# Patient Record
Sex: Male | Born: 1950 | Race: White | Hispanic: No | Smoking: Former smoker
Health system: Southern US, Community
[De-identification: ages and names within clinical notes are randomized; demographics above are authoritative.]

## PROBLEM LIST (undated history)

## (undated) DIAGNOSIS — K760 Fatty (change of) liver, not elsewhere classified: Secondary | ICD-10-CM

## (undated) DIAGNOSIS — F419 Anxiety disorder, unspecified: Secondary | ICD-10-CM

## (undated) DIAGNOSIS — K759 Inflammatory liver disease, unspecified: Secondary | ICD-10-CM

## (undated) DIAGNOSIS — Z8619 Personal history of other infectious and parasitic diseases: Secondary | ICD-10-CM

## (undated) DIAGNOSIS — M1990 Unspecified osteoarthritis, unspecified site: Secondary | ICD-10-CM

## (undated) DIAGNOSIS — K219 Gastro-esophageal reflux disease without esophagitis: Secondary | ICD-10-CM

## (undated) DIAGNOSIS — H269 Unspecified cataract: Secondary | ICD-10-CM

## (undated) DIAGNOSIS — N3281 Overactive bladder: Secondary | ICD-10-CM

## (undated) HISTORY — DX: Personal history of other infectious and parasitic diseases: Z86.19

## (undated) HISTORY — PX: CATARACT EXTRACTION: SUR2

## (undated) HISTORY — PX: APPENDECTOMY: SHX54

## (undated) HISTORY — DX: Unspecified osteoarthritis, unspecified site: M19.90

## (undated) HISTORY — DX: Overactive bladder: N32.81

## (undated) HISTORY — DX: Fatty (change of) liver, not elsewhere classified: K76.0

## (undated) HISTORY — DX: Unspecified cataract: H26.9

## (undated) HISTORY — DX: Inflammatory liver disease, unspecified: K75.9

## (undated) HISTORY — DX: Anxiety disorder, unspecified: F41.9

## (undated) HISTORY — PX: EYE SURGERY: SHX253

## (undated) HISTORY — DX: Gastro-esophageal reflux disease without esophagitis: K21.9

## (undated) HISTORY — PX: TONSILLECTOMY: SUR1361

---

## 1990-12-10 HISTORY — PX: CHOLECYSTECTOMY: SHX55

## 1999-11-14 ENCOUNTER — Ambulatory Visit (HOSPITAL_COMMUNITY): Admission: RE | Admit: 1999-11-14 | Discharge: 1999-11-14 | Payer: Self-pay | Admitting: Gastroenterology

## 1999-11-14 ENCOUNTER — Encounter (INDEPENDENT_AMBULATORY_CARE_PROVIDER_SITE_OTHER): Payer: Self-pay | Admitting: *Deleted

## 1999-11-15 ENCOUNTER — Encounter: Payer: Self-pay | Admitting: Gastroenterology

## 2002-09-28 ENCOUNTER — Ambulatory Visit (HOSPITAL_COMMUNITY): Admission: RE | Admit: 2002-09-28 | Discharge: 2002-09-28 | Payer: Self-pay | Admitting: Gastroenterology

## 2002-09-28 ENCOUNTER — Encounter (INDEPENDENT_AMBULATORY_CARE_PROVIDER_SITE_OTHER): Payer: Self-pay | Admitting: *Deleted

## 2004-10-12 ENCOUNTER — Encounter: Admission: RE | Admit: 2004-10-12 | Discharge: 2004-10-12 | Payer: Self-pay | Admitting: Cardiology

## 2008-01-20 ENCOUNTER — Ambulatory Visit (HOSPITAL_COMMUNITY): Admission: RE | Admit: 2008-01-20 | Discharge: 2008-01-20 | Payer: Self-pay | Admitting: Gastroenterology

## 2011-04-24 NOTE — Op Note (Signed)
NAMEKERRICK, MILER                ACCOUNT NO.:  [id number]   MEDICAL RECORD NO.:  [id number]          PATIENT TYPE:  AMB   LOCATION:  ENDO                         FACILITY:  [facility]   PHYSICIAN:  Bernette Redbird, M.D.   DATE OF BIRTH:  04/22/1951   DATE OF PROCEDURE:  01/20/2008  DATE OF DISCHARGE:                               OPERATIVE REPORT   PROCEDURE:  Colonoscopy.   INDICATION:  Follow up of solitary diminutive colonic adenoma removed  five years ago colonoscopically.   FINDINGS:  Normal exam to the cecum.   PROCEDURE IN DETAIL:  The nature, purpose, and risks of the procedure  were known to the patient from prior examination and he provided written  consent.  Sedation was fentanyl 100 mcg, Versed 10 mg IV, and Benadryl  25 mg IV without arrhythmias, desaturation or clinical instability  during the course of the procedure.  Digital exam showed, perhaps, a  slightly enlarged but smooth prostate gland.  The Pentax adult video  colonoscope was advanced with some looping to the cecum, turning the  patient into the supine position to facilitate the tip of the scope  dropping into the base of the cecum.  Pullback was then performed.  The  quality of the prep was fairly good.  I had to do a moderate amount of  washing and suctioning, but it is felt that all areas were adequately  seen.   This was a normal examination.  I did not see any polyps, cancer,  colitis, vascular malformations or diverticulosis.  Retroflexion was  grossly normal.  No biopsies were obtained.  The patient tolerated the  procedure well and there were no apparent complications.  Reinspection  of the rectum and rectosigmoid was unremarkable.   IMPRESSION:  Normal surveillance colonoscopy in a patient with a prior  history of a solitary diminutive colonic adenoma.   PLAN:  Repeat colonoscopy in five years for surveillance.           ______________________________  Bernette Redbird, M.D.     RB/MEDQ  D:   01/20/2008  T:  01/22/2008  Job:  [id number]

## 2011-04-27 NOTE — Op Note (Signed)
   Zachary Lawrence, Zachary Lawrence                          ACCOUNT NO.:  [id number]   MEDICAL RECORD NO.:  [id number]                   PATIENT TYPE:  AMB   LOCATION:  ENDO                                 FACILITY:  [facility]   PHYSICIAN:  Bernette Redbird, MD                  DATE OF BIRTH:  Jun 23, 1951   DATE OF PROCEDURE:  DATE OF DISCHARGE:                                 OPERATIVE REPORT   PROCEDURE:  Colonoscopy with biopsy.   ENDOSCOPIST:  Bernette Redbird, M.D.   INDICATIONS FOR PROCEDURE:  This 61 year old for colon cancer screening.   FINDINGS:  Diminutive sessile polyp in the proximal colon.   DESCRIPTION OF PROCEDURE:  The nature, purpose and risks of the procedure  had been explained to the patient, who provided a written consent.  Sedation  was fentanyl 125 mcg and Versed 10 mg IV, without arrhythmias or  desaturation.  A detailed exam of the prostate was normal.  The Olympus  adult video colonoscope was advanced without significant difficulty to the  cecum, as identified by visualization of the appendiceal orifice and the  ileocecal valve.  Some external abdominal compression was used to help  control the looping.  Because the prep was excellent, it was felt that all  areas were well seen during pullback.  There was a sessile 3.0 mm polyp on  the mucosa just above the cecum, removed by several cold biopsies.  No other  polyps were seen, and there was no evidence of cancer, colitis,  diverticulosis, or vascular malformations.  Retroflexion in the rectum was  attempted, but could not be accomplished due to a small rectal ampulla.  The  inspection of the rectosigmoid was unremarkable and pullout through the anal  canal did not demonstrate any significant internal hemorrhoids.  The patient tolerated the procedure well, and there were no apparent  complications.   IMPRESSION:  Solitary diminutive polyp, otherwise normal examination.   PLAN:  Await the pathology on the polyp, with a  follow-up colonoscopy in  five years if it is adenomatous in [employer].  Otherwise either a flexible  sigmoidoscopy or colonoscopy to be considered for continued screening about  five years from now.                                                Bernette Redbird, MD    RB/MEDQ  D:  09/28/2002  T:  09/28/2002  Job:  [id number]

## 2011-08-31 LAB — COMPREHENSIVE METABOLIC PANEL
ALT: 18
AST: 26
Albumin: 3.7
Alkaline Phosphatase: 52
BUN: 11
CO2: 26
Calcium: 8.8
Chloride: 107
Creatinine, Ser: 1.36
GFR calc Af Amer: >60
GFR calc non Af Amer: 54 — ABNORMAL LOW
Glucose, Bld: 81
Potassium: 4
Sodium: 141
Total Bilirubin: 2.1 — ABNORMAL HIGH
Total Protein: 5.9 — ABNORMAL LOW

## 2011-08-31 LAB — CBC
HCT: 43.7
Hemoglobin: 15.3
MCHC: 34.9
MCV: 92.6
Platelets: 155
RBC: 4.72
RDW: 13.4
WBC: 5.1

## 2014-09-09 DIAGNOSIS — K219 Gastro-esophageal reflux disease without esophagitis: Secondary | ICD-10-CM | POA: Insufficient documentation

## 2014-09-09 DIAGNOSIS — Z8619 Personal history of other infectious and parasitic diseases: Secondary | ICD-10-CM | POA: Insufficient documentation

## 2014-09-09 DIAGNOSIS — N529 Male erectile dysfunction, unspecified: Secondary | ICD-10-CM | POA: Insufficient documentation

## 2014-09-09 DIAGNOSIS — J309 Allergic rhinitis, unspecified: Secondary | ICD-10-CM | POA: Insufficient documentation

## 2015-01-26 ENCOUNTER — Encounter (HOSPITAL_COMMUNITY): Payer: Self-pay | Admitting: Emergency Medicine

## 2015-01-26 ENCOUNTER — Emergency Department (HOSPITAL_COMMUNITY)
Admission: EM | Admit: 2015-01-26 | Discharge: 2015-01-26 | Disposition: A | Discharge status: Home / Self Care | Payer: 59 | Attending: Emergency Medicine | Admitting: Emergency Medicine

## 2015-01-26 DIAGNOSIS — Y9289 Other specified places as the place of occurrence of the external cause: Secondary | ICD-10-CM | POA: Insufficient documentation

## 2015-01-26 DIAGNOSIS — W108XXA Fall (on) (from) other stairs and steps, initial encounter: Secondary | ICD-10-CM | POA: Diagnosis not present

## 2015-01-26 DIAGNOSIS — R110 Nausea: Secondary | ICD-10-CM | POA: Insufficient documentation

## 2015-01-26 DIAGNOSIS — W19XXXA Unspecified fall, initial encounter: Secondary | ICD-10-CM

## 2015-01-26 DIAGNOSIS — Y9389 Activity, other specified: Secondary | ICD-10-CM | POA: Insufficient documentation

## 2015-01-26 DIAGNOSIS — Y998 Other external cause status: Secondary | ICD-10-CM | POA: Diagnosis not present

## 2015-01-26 MED ORDER — NAPROXEN 500 MG PO TABS: 500.0000 mg | ORAL_TABLET | Freq: Two times a day (BID) | ORAL | Status: DC

## 2015-01-26 NOTE — Discharge Instructions (Signed)
Fall Prevention and Home Safety Falls cause injuries and can affect all age groups. It is possible to use preventive measures to significantly decrease the likelihood of falls. There are many simple measures which can make your home safer and prevent falls. OUTDOORS  Repair cracks and edges of walkways and driveways.  Remove high doorway thresholds.  Trim shrubbery on the main path into your home.  Have good outside lighting.  Clear walkways of tools, rocks, debris, and clutter.  Check that handrails are not broken and are securely fastened. Both sides of steps should have handrails.  Have leaves, snow, and ice cleared regularly.  Use sand or salt on walkways during winter months.  In the garage, clean up grease or oil spills. BATHROOM  Install night lights.  Install grab bars by the toilet and in the tub and shower.  Use non-skid mats or decals in the tub or shower.  Place a plastic non-slip stool in the shower to sit on, if needed.  Keep floors dry and clean up all water on the floor immediately.  Remove soap buildup in the tub or shower on a regular basis.  Secure bath mats with non-slip, double-sided rug tape.  Remove throw rugs and tripping hazards from the floors. BEDROOMS  Install night lights.  Make sure a bedside light is easy to reach.  Do not use oversized bedding.  Keep a telephone by your bedside.  Have a firm chair with side arms to use for getting dressed.  Remove throw rugs and tripping hazards from the floor. KITCHEN  Keep handles on pots and pans turned toward the center of the stove. Use back burners when possible.  Clean up spills quickly and allow time for drying.  Avoid walking on wet floors.  Avoid hot utensils and knives.  Position shelves so they are not too high or low.  Place commonly used objects within easy reach.  If necessary, use a sturdy step stool with a grab bar when reaching.  Keep electrical cables out of the  way.  Do not use floor polish or wax that makes floors slippery. If you must use wax, use non-skid floor wax.  Remove throw rugs and tripping hazards from the floor. STAIRWAYS  Never leave objects on stairs.  Place handrails on both sides of stairways and use them. Fix any loose handrails. Make sure handrails on both sides of the stairways are as long as the stairs.  Check carpeting to make sure it is firmly attached along stairs. Make repairs to worn or loose carpet promptly.  Avoid placing throw rugs at the top or bottom of stairways, or properly secure the rug with carpet tape to prevent slippage. Get rid of throw rugs, if possible.  Have an electrician put in a light switch at the top and bottom of the stairs. OTHER FALL PREVENTION TIPS  Wear low-heel or rubber-soled shoes that are supportive and fit well. Wear closed toe shoes.  When using a stepladder, make sure it is fully opened and both spreaders are firmly locked. Do not climb a closed stepladder.  Add color or contrast paint or tape to grab bars and handrails in your home. Place contrasting color strips on first and last steps.  Learn and use mobility aids as needed. Install an electrical emergency response system.  Turn on lights to avoid dark areas. Replace light bulbs that burn out immediately. Get light switches that glow.  Arrange furniture to create clear pathways. Keep furniture in the same place.  Firmly attach carpet with non-skid or double-sided tape.  Eliminate uneven floor surfaces.  Select a carpet pattern that does not visually hide the edge of steps.  Be aware of all pets. OTHER HOME SAFETY TIPS  Set the water temperature for 120 F (48.8 C).  Keep emergency numbers on or near the telephone.  Keep smoke detectors on every level of the home and near sleeping areas. Document Released: 11/16/2002 Document Revised: 05/27/2012 Document Reviewed: 02/15/2012 [facility] Patient Information 2015  [place], [place]. This information is not intended to replace advice given to you by your health care provider. Make sure you discuss any questions you have with your health care provider.   You were evaluated in the ED today following her fall. There is not appear to be any emergent consequences from your fall today. It is important for you to follow-up with your primary care within 48 hours for further evaluation and management of your symptoms. You may take for naproxen as prescribed for any moderate discomfort or inflammation may experience. It is also important for you to try to rest as much as possible and avoid watching TV, looking at computer screens orally and bright lights.

## 2015-01-26 NOTE — ED Provider Notes (Signed)
CSN: [id number]     Arrival date & time 01/26/15  1203 History   First MD Initiated Contact with Patient 01/26/15 1222     Chief Complaint  Patient presents with  . Fall  . Nausea     (Consider location/radiation/quality/duration/timing/severity/associated sxs/prior Treatment) HPI Zachary Lawrence is a 64 y.o. male comes in for evaluation after a fall. Patient states earlier this morning at approximately 8:00 he was walking down the steps in his socks when he slipped on the last step and his feet flew out in front of him and he hit his head on the last step. He denies losing consciousness, has been immediately ambulatory since the event. He reports intermittent mild nausea throughout the day with no vomiting. He denies any vision changes, rhinorrhea, otorrhea. He denies headache at this time, but tenderness to the back of his head upon palpation. No neck pain or back pain. He has not taken anything for his discomfort and reports "I really don't hurt at all unless I push on it". Does not take anticoagulation. His overall very healthy. He is able to recall the entire event and everything thereafter.  History reviewed. No pertinent past medical history. Past Surgical History  Procedure Laterality Date  . Cholecystectomy    . Tonsillectomy     No family history on file. History  Substance Use Topics  . Smoking status: Never Smoker   . Smokeless tobacco: Not on file  . Alcohol Use: Yes    Review of Systems A 10 point review of systems was completed and was negative except for pertinent positives and negatives as mentioned in the history of present illness     Allergies  Review of patient's allergies indicates no known allergies.  Home Medications   Prior to Admission medications   Medication Sig Start Date End Date Taking? Authorizing Provider  aspirin EC 325 MG tablet Take 325 mg by mouth as needed for mild pain.   Yes Historical Provider, MD  Omega-3 Fatty Acids (OMEGA 3 PO) Take  1 tablet by mouth daily.   Yes Historical Provider, MD  PRESCRIPTION MEDICATION Take 1 tablet by mouth at bedtime. For over active bladder   Yes Historical Provider, MD  naproxen (NAPROSYN) 500 MG tablet Take 1 tablet (500 mg total) by mouth 2 (two) times daily. 01/26/15   Viona Gilmore Alexius Hangartner, PA-C   BP 145/86 mmHg  Pulse 62  Temp(Src) 97.7 F (36.5 C) (Oral)  Resp 18  SpO2 99% Physical Exam  Constitutional: He is oriented to person, place, and time. He appears well-developed and well-nourished.  HENT:  Head: Normocephalic and atraumatic.  Mouth/Throat: Oropharynx is clear and moist.  No battle sign or raccoon eyes. No evidence of skull fracture, no crepitus. No lesions identified.  Eyes: Conjunctivae are normal. Pupils are equal, round, and reactive to light. Right eye exhibits no discharge. Left eye exhibits no discharge. No scleral icterus.  Neck: Neck supple.  Cardiovascular: Normal rate, regular rhythm and normal heart sounds.   Pulmonary/Chest: Effort normal and breath sounds normal. No respiratory distress. He has no wheezes. He has no rales.  Abdominal: Soft. There is no tenderness.  Musculoskeletal: He exhibits no tenderness.  Full ROM of c,t,l spine with no midline bony tenderness.  Neurological: He is alert and oriented to person, place, and time.  Cranial Nerves II-XII grossly intact. Motor and sensation 5/5 in all 4 extremities. Gait is baseline without any ataxia. Extraocular movements intact without nystagmus. Completes finger to nose and  heel to shin coordination movements without difficulty.  Skin: Skin is warm and dry. No rash noted.  Psychiatric: He has a normal mood and affect.  Nursing note and vitals reviewed.   ED Course  Procedures (including critical care time) Labs Review Labs Reviewed - No data to display  Imaging Review No results found.   EKG Interpretation None     Meds given in ED:  Medications - No data to display  Discharge Medication List  as of 01/26/2015  3:05 PM    START taking these medications   Details  naproxen (NAPROSYN) 500 MG tablet Take 1 tablet (500 mg total) by mouth 2 (two) times daily., Starting 01/26/2015, Until Discontinued, Print       Filed Vitals:   01/26/15 1210 01/26/15 1338  BP: 138/82 145/86  Pulse: 74 62  Temp: 97.7 F (36.5 C)   TempSrc: Oral   Resp: 16 18  SpO2: 100% 99%    MDM  Not necessary to perform head CT, patient does not require one according to [place] CT head injury rule. Score was 0/negative Will DC with Zofran and NSAIDS for potential mild concussion. Vitals stable - WNL -afebrile Pt resting comfortably in ED. PE--not concerning further acute or emergent pathology. Patient with normal neurological exam.  I discussed all relevant lab findings and imaging results with pt and they verbalized understanding. Discussed f/u with PCP within 48 hrs and return precautions, pt very amenable to plan.  Prior to patient discharge, I discussed and reviewed this case with Dr.Wofford    Final diagnoses:  Fall, initial encounter        Verl Dicker, PA-C 01/26/15 [place], MD 01/27/15 [phone]

## 2015-01-26 NOTE — ED Notes (Signed)
Pt states that this morning he was going down his hardwood stairs when he slipped on the last step in his golf socks and fell hitting his posterior head on bottom step. Pt denies any LOC, but states that ever since he felt little nauseated and "funny".  Pt denies taking blood thinners but takes an occasional baby ASA.

## 2015-12-22 DIAGNOSIS — N138 Other obstructive and reflux uropathy: Secondary | ICD-10-CM | POA: Diagnosis not present

## 2015-12-22 DIAGNOSIS — N401 Enlarged prostate with lower urinary tract symptoms: Secondary | ICD-10-CM | POA: Diagnosis not present

## 2016-02-09 DIAGNOSIS — Z0000 Encounter for general adult medical examination without abnormal findings: Secondary | ICD-10-CM | POA: Diagnosis not present

## 2016-02-09 DIAGNOSIS — N401 Enlarged prostate with lower urinary tract symptoms: Secondary | ICD-10-CM | POA: Diagnosis not present

## 2016-02-09 DIAGNOSIS — R0789 Other chest pain: Secondary | ICD-10-CM | POA: Diagnosis not present

## 2016-02-09 DIAGNOSIS — R42 Dizziness and giddiness: Secondary | ICD-10-CM | POA: Diagnosis not present

## 2016-02-09 DIAGNOSIS — Z7689 Persons encountering health services in other specified circumstances: Secondary | ICD-10-CM | POA: Diagnosis not present

## 2016-02-09 DIAGNOSIS — Z13220 Encounter for screening for lipoid disorders: Secondary | ICD-10-CM | POA: Diagnosis not present

## 2016-03-04 NOTE — Progress Notes (Signed)
Cardiology Office Note   Date:  03/06/2016   ID:  Zachary Lawrence, DOB 20-Jun-1951, MRN [id number]  PCP:  Lilian Coma., MD  Cardiologist:   Minus Breeding, MD   Chief Complaint  Patient presents with  . New Evaluation  . Chest Pain  . Shortness of Breath  . Dizziness      History of Present Illness: Zachary Lawrence is a 65 y.o. male who presents for evaluation of chest discomfort. He has a history of chest discomfort and has had testing in the past. I don't have any of these records. He reports a distant history of cardiac catheterization and stress testing. He's not been told that he had any obstructive coronary disease. His father did have heart disease apparently in his 36s. He reports that he's been getting increasing shortness of breath with activity such as dragging yard trash curb. He says he's noticed increasing shortness of breath doing this and he has to stop and recover. He does not have any resting shortness of breath, PND or orthopnea. He's had a history of chronic chest pain with activity such as walking on a treadmill. He thinks this is more frequent and intense. He does not necessarily get this discomfort when he's working in his yard but he might. This might be similar to previous symptoms are more intense. He also has some sharp occasional fleeting chest pain which is different than this. He does not have jaw or arm pain. He does not notice palpitations, presyncope or syncope. He does get some dizziness and pins and needle feeling in his head.   Past Medical History  Diagnosis Date  . Overactive bladder     Past Surgical History  Procedure Laterality Date  . Cholecystectomy    . Tonsillectomy       Current Outpatient Prescriptions  Medication Sig Dispense Refill  . aspirin EC 325 MG tablet Take 325 mg by mouth as needed for mild pain.    . naproxen (NAPROSYN) 500 MG tablet Take 1 tablet (500 mg total) by mouth 2 (two) times daily. 30 tablet 0  . Omega-3  Fatty Acids (OMEGA 3 PO) Take 1 tablet by mouth daily.    Marland Kitchen PRESCRIPTION MEDICATION Take 1 tablet by mouth at bedtime. For over active bladder     No current facility-administered medications for this visit.    Allergies:   Review of patient's allergies indicates no known allergies.    Social History:  The patient  reports that he has never smoked. He does not have any smokeless tobacco history on file. He reports that he drinks alcohol.   Family History:  The patient's family history includes CAD (age of onset: 30) in his father; Ovarian cancer in his sister.    ROS:  Please see the history of present illness.   Otherwise, review of systems are positive for ED.   All other systems are reviewed and negative.    PHYSICAL EXAM: VS:  BP 112/62 mmHg  Pulse 54  Ht 6' (1.829 m)  Wt 182 lb (82.555 kg)  BMI 24.68 kg/m2 , BMI Body mass index is 24.68 kg/(m^2). GENERAL:  Well appearing HEENT:  Pupils equal round and reactive, fundi not visualized, oral mucosa unremarkable NECK:  No jugular venous distention, waveform within normal limits, carotid upstroke brisk and symmetric, no bruits, no thyromegaly LYMPHATICS:  No cervical, inguinal adenopathy LUNGS:  Clear to auscultation bilaterally BACK:  No CVA tenderness CHEST:  Unremarkable HEART:  PMI not displaced  or sustained,S1 and S2 within normal limits, no S3, no S4, no clicks, no rubs, no murmurs ABD:  Flat, positive bowel sounds normal in frequency in pitch, no bruits, no rebound, no guarding, no midline pulsatile mass, no hepatomegaly, no splenomegaly EXT:  2 plus pulses throughout, no edema, no cyanosis no clubbing SKIN:  No rashes no nodules NEURO:  Cranial nerves II through XII grossly intact, motor grossly intact throughout PSYCH:  Cognitively intact, oriented to person place and time    EKG:  EKG is ordered today. The ekg ordered today demonstrates Sinus rhythm, rate 54, axis within normal limits, intervals within normal limits,  no acute ST-T wave changes.   Recent Labs: No results found for requested labs within last 365 days.    Lipid Panel No results found for: CHOL, TRIG, HDL, CHOLHDL, VLDL, LDLCALC, LDLDIRECT    Wt Readings from Last 3 Encounters:  03/06/16 182 lb (82.555 kg)      Other studies Reviewed: Additional studies/ records that were reviewed today include: Outside labs. Review of the above records demonstrates:  Please see elsewhere in the note.     ASSESSMENT AND PLAN:  CHEST PAIN:  His chest pain is somewhat atypical. I'm going to start with an exercise treadmill test and a coronary calcium score. Further evaluation will be based on this.  DIZZINESS:  He was orthostatic. We discussed salt loading and hydration.  DYSLIPIDEMIA:  His LDL was 123 but his HDL was 73 on outside labs. Further management of this will be based on the results   Current medicines are reviewed at length with the patient today.  The patient does not have concerns regarding medicines.  The following changes have been made:  no change  Labs/ tests ordered today include:   Orders Placed This Encounter  Procedures  . CT Cardiac Scoring  . Exercise Tolerance Test  . EKG 12-Lead     Disposition:   FU with me as needed.      Signed, Minus Breeding, MD  03/06/2016 9:28 AM    [facility]

## 2016-03-06 ENCOUNTER — Encounter: Payer: Self-pay | Admitting: Cardiology

## 2016-03-06 ENCOUNTER — Ambulatory Visit (INDEPENDENT_AMBULATORY_CARE_PROVIDER_SITE_OTHER): Payer: Medicare Other | Admitting: Cardiology

## 2016-03-06 VITALS — BP 112/62 | HR 54 | Ht 72.0 in | Wt 182.0 lb

## 2016-03-06 DIAGNOSIS — R072 Precordial pain: Secondary | ICD-10-CM | POA: Diagnosis not present

## 2016-03-06 DIAGNOSIS — R42 Dizziness and giddiness: Secondary | ICD-10-CM

## 2016-03-06 DIAGNOSIS — R0602 Shortness of breath: Secondary | ICD-10-CM | POA: Diagnosis not present

## 2016-03-06 DIAGNOSIS — R0609 Other forms of dyspnea: Secondary | ICD-10-CM | POA: Insufficient documentation

## 2016-03-06 NOTE — Patient Instructions (Signed)
Your physician recommends that you schedule a follow-up appointment in: As Needed  Your physician has requested that you have an exercise tolerance test. For further information please visit [email or web address]. Please also follow instruction sheet, as given.  Your physician has requested that you have a Coronary Calcium Scoring Test, this is done at our [facility].

## 2016-03-08 ENCOUNTER — Ambulatory Visit (INDEPENDENT_AMBULATORY_CARE_PROVIDER_SITE_OTHER)
Admission: RE | Admit: 2016-03-08 | Discharge: 2016-03-08 | Disposition: A | Discharge status: Home / Self Care | Payer: Self-pay | Source: Ambulatory Visit | Attending: Cardiology | Admitting: Cardiology

## 2016-03-08 ENCOUNTER — Ambulatory Visit: Payer: 59 | Admitting: Cardiology

## 2016-03-08 DIAGNOSIS — R0602 Shortness of breath: Secondary | ICD-10-CM

## 2016-03-22 ENCOUNTER — Telehealth (HOSPITAL_COMMUNITY): Payer: Self-pay

## 2016-03-22 NOTE — Telephone Encounter (Signed)
Encounter complete. 

## 2016-03-27 ENCOUNTER — Ambulatory Visit (HOSPITAL_COMMUNITY)
Admission: RE | Admit: 2016-03-27 | Discharge: 2016-03-27 | Disposition: A | Discharge status: Home / Self Care | Payer: Medicare Other | Source: Ambulatory Visit | Attending: Cardiology | Admitting: Cardiology

## 2016-03-27 DIAGNOSIS — R0602 Shortness of breath: Secondary | ICD-10-CM | POA: Diagnosis not present

## 2016-03-27 DIAGNOSIS — R9439 Abnormal result of other cardiovascular function study: Secondary | ICD-10-CM | POA: Insufficient documentation

## 2016-03-27 LAB — EXERCISE TOLERANCE TEST
CHL CUP RESTING HR STRESS: 70 {beats}/min
CHL RATE OF PERCEIVED EXERTION: 15
Estimated workload: 13.4 METS
Exercise duration (min): 11 min
MPHR: 155 {beats}/min
Peak HR: 133 {beats}/min
Percent HR: 85 %

## 2016-05-17 DIAGNOSIS — R358 Other polyuria: Secondary | ICD-10-CM | POA: Diagnosis not present

## 2016-05-17 DIAGNOSIS — N138 Other obstructive and reflux uropathy: Secondary | ICD-10-CM | POA: Diagnosis not present

## 2016-05-17 DIAGNOSIS — R339 Retention of urine, unspecified: Secondary | ICD-10-CM | POA: Diagnosis not present

## 2016-05-17 DIAGNOSIS — N401 Enlarged prostate with lower urinary tract symptoms: Secondary | ICD-10-CM | POA: Diagnosis not present

## 2016-05-30 DIAGNOSIS — H6123 Impacted cerumen, bilateral: Secondary | ICD-10-CM | POA: Diagnosis not present

## 2016-10-18 DIAGNOSIS — Z85828 Personal history of other malignant neoplasm of skin: Secondary | ICD-10-CM | POA: Diagnosis not present

## 2016-10-18 DIAGNOSIS — L570 Actinic keratosis: Secondary | ICD-10-CM | POA: Diagnosis not present

## 2016-10-18 DIAGNOSIS — Z08 Encounter for follow-up examination after completed treatment for malignant neoplasm: Secondary | ICD-10-CM | POA: Diagnosis not present

## 2016-10-18 DIAGNOSIS — L219 Seborrheic dermatitis, unspecified: Secondary | ICD-10-CM | POA: Diagnosis not present

## 2016-11-16 DIAGNOSIS — N138 Other obstructive and reflux uropathy: Secondary | ICD-10-CM | POA: Diagnosis not present

## 2016-11-16 DIAGNOSIS — T387X5D Adverse effect of androgens and anabolic congeners, subsequent encounter: Secondary | ICD-10-CM | POA: Diagnosis not present

## 2016-11-16 DIAGNOSIS — N401 Enlarged prostate with lower urinary tract symptoms: Secondary | ICD-10-CM | POA: Diagnosis not present

## 2016-11-16 DIAGNOSIS — R358 Other polyuria: Secondary | ICD-10-CM | POA: Diagnosis not present

## 2016-11-16 DIAGNOSIS — E291 Testicular hypofunction: Secondary | ICD-10-CM | POA: Diagnosis not present

## 2017-05-24 DIAGNOSIS — N138 Other obstructive and reflux uropathy: Secondary | ICD-10-CM | POA: Diagnosis not present

## 2017-05-24 DIAGNOSIS — R339 Retention of urine, unspecified: Secondary | ICD-10-CM | POA: Diagnosis not present

## 2017-05-24 DIAGNOSIS — N401 Enlarged prostate with lower urinary tract symptoms: Secondary | ICD-10-CM | POA: Diagnosis not present

## 2017-06-03 DIAGNOSIS — N138 Other obstructive and reflux uropathy: Secondary | ICD-10-CM | POA: Diagnosis not present

## 2017-06-03 DIAGNOSIS — N401 Enlarged prostate with lower urinary tract symptoms: Secondary | ICD-10-CM | POA: Diagnosis not present

## 2017-06-16 DIAGNOSIS — S90861A Insect bite (nonvenomous), right foot, initial encounter: Secondary | ICD-10-CM | POA: Diagnosis not present

## 2017-06-16 DIAGNOSIS — A932 Colorado tick fever: Secondary | ICD-10-CM | POA: Diagnosis not present

## 2017-06-20 DIAGNOSIS — N401 Enlarged prostate with lower urinary tract symptoms: Secondary | ICD-10-CM | POA: Diagnosis not present

## 2017-06-20 DIAGNOSIS — N138 Other obstructive and reflux uropathy: Secondary | ICD-10-CM | POA: Diagnosis not present

## 2017-09-27 DIAGNOSIS — R339 Retention of urine, unspecified: Secondary | ICD-10-CM | POA: Diagnosis not present

## 2017-09-27 DIAGNOSIS — N401 Enlarged prostate with lower urinary tract symptoms: Secondary | ICD-10-CM | POA: Diagnosis not present

## 2017-09-27 DIAGNOSIS — N138 Other obstructive and reflux uropathy: Secondary | ICD-10-CM | POA: Diagnosis not present

## 2017-10-24 DIAGNOSIS — Z08 Encounter for follow-up examination after completed treatment for malignant neoplasm: Secondary | ICD-10-CM | POA: Diagnosis not present

## 2017-10-24 DIAGNOSIS — L570 Actinic keratosis: Secondary | ICD-10-CM | POA: Diagnosis not present

## 2017-10-24 DIAGNOSIS — Z85828 Personal history of other malignant neoplasm of skin: Secondary | ICD-10-CM | POA: Diagnosis not present

## 2017-11-11 ENCOUNTER — Ambulatory Visit: Payer: Medicare Other | Admitting: Nurse Practitioner

## 2017-11-12 ENCOUNTER — Ambulatory Visit: Payer: Medicare Other | Admitting: Nurse Practitioner

## 2017-11-15 ENCOUNTER — Ambulatory Visit (INDEPENDENT_AMBULATORY_CARE_PROVIDER_SITE_OTHER): Payer: Medicare Other | Admitting: Nurse Practitioner

## 2017-11-15 ENCOUNTER — Encounter: Payer: Self-pay | Admitting: Nurse Practitioner

## 2017-11-15 VITALS — BP 108/64 | HR 65 | Temp 97.9°F | Ht 72.0 in | Wt 185.0 lb

## 2017-11-15 DIAGNOSIS — R079 Chest pain, unspecified: Secondary | ICD-10-CM | POA: Diagnosis not present

## 2017-11-15 DIAGNOSIS — R42 Dizziness and giddiness: Secondary | ICD-10-CM | POA: Diagnosis not present

## 2017-11-15 NOTE — Patient Instructions (Addendum)
Please return to lab next week for blood draw (need to be fasting at least 6-8hrs prior to blood draw).  Please sign medical release to get records from Dr. Burnell Blanks (urology) and Dr. Cristina Gong [facility]).  Rehydration, Adult Rehydration is the replacement of body fluids and salts and minerals (electrolytes) that are lost during dehydration. Dehydration is when there is not enough fluid or water in the body. This happens when you lose more fluids than you take in. Common causes of dehydration include:  Vomiting.  Diarrhea.  Excessive sweating, such as from heat exposure or exercise.  Taking medicines that cause the body to lose excess fluid (diuretics).  Impaired kidney function.  Not drinking enough fluid.  Certain illnesses or infections.  Certain poorly controlled long-term (chronic) illnesses, such as diabetes, heart disease, and kidney disease.  Symptoms of mild dehydration may include thirst, dry lips and mouth, dry skin, and dizziness. Symptoms of severe dehydration may include increased heart rate, confusion, fainting, and not urinating. You can rehydrate by drinking certain fluids or getting fluids through an IV tube, as told by your health care provider. What are the risks? Generally, rehydration is safe. However, one problem that can happen is taking in too much fluid (overhydration). This is rare. If overhydration happens, it can cause an electrolyte imbalance, kidney failure, or a decrease in salt (sodium) levels in the body. How to rehydrate Follow instructions from your health care provider for rehydration. The kind of fluid you should drink and the amount you should drink depend on your condition.  If directed by your health care provider, drink an oral rehydration solution (ORS). This is a drink designed to treat dehydration that is found in pharmacies and retail stores. ? Make an ORS by following instructions on the package. ? Start by drinking small  amounts, about  cup (120 mL) every 5-10 minutes. ? Slowly increase how much you drink until you have taken the amount recommended by your health care provider.  Drink enough clear fluids to keep your urine clear or pale yellow. If you were instructed to drink an ORS, finish the ORS first, then start slowly drinking other clear fluids. Drink fluids such as: ? Water. Do not drink only water. Doing that can lead to having too little sodium in your body (hyponatremia). ? Ice chips. ? Fruit juice that you have added water to (diluted juice). ? Low-calorie sports drinks.  If you are severely dehydrated, your health care provider may recommend that you receive fluids through an IV tube in the hospital.  Do not take sodium tablets. Doing that can lead to the condition of having too much sodium in your body (hypernatremia).  Eating while you rehydrate Follow instructions from your health care provider about what to eat while you rehydrate. Your health care provider may recommend that you slowly begin eating regular foods in small amounts.  Eat foods that contain a healthy balance of electrolytes, such as bananas, oranges, potatoes, tomatoes, and spinach.  Avoid foods that are greasy or contain a lot of fat or sugar.  In some cases, you may get nutrition through a feeding tube that is passed through your nose and into your stomach (nasogastric tube, or NG tube). This may be done if you have uncontrolled vomiting or diarrhea. Beverages to avoid Certain beverages may make dehydration worse. While you rehydrate, avoid:  Alcohol.  Caffeine.  Drinks that contain a lot of sugar. These include: ? High-calorie sports drinks. ? Fruit juice that is  not diluted. ? Soda.  Check nutrition labels to see how much sugar or caffeine a beverage contains. Signs of dehydration recovery You may be recovering from dehydration if:  You are urinating more often than before you started rehydrating.  Your urine  is clear or pale yellow.  Your energy level improves.  You vomit less frequently.  You have diarrhea less frequently.  Your appetite improves or returns to normal.  You feel less dizzy or less light-headed.  Your skin tone and color start to look more normal.  Contact a health care provider if:  You continue to have symptoms of mild dehydration, such as: ? Thirst. ? Dry lips. ? Slightly dry mouth. ? Dry, warm skin. ? Dizziness.  You continue to vomit or have diarrhea. Get help right away if:  You have symptoms of dehydration that get worse.  You feel: ? Confused. ? Weak. ? Like you are going to faint.  You have not urinated in 6-8 hours.  You have very dark urine.  You have trouble breathing.  Your heart rate while sitting still is over 100 beats a minute.  You cannot drink fluids without vomiting.  You have vomiting or diarrhea that: ? Gets worse. ? Does not go away.  You have a fever. This information is not intended to replace advice given to you by your health care provider. Make sure you discuss any questions you have with your health care provider. Document Released: 02/18/2012 Document Revised: 06/15/2016 Document Reviewed: 01/20/2016 Elsevier Interactive Patient Education  [employer].

## 2017-11-15 NOTE — Progress Notes (Signed)
Subjective:  Patient ID: Zachary Bard., male    DOB: 30-Aug-1951  Age: 66 y.o. MRN: [id number]  CC: Establish Care (est care--dizziness--)   HPI   Dr. Valora Piccolo (previous pcp). Last seen last year. Dr. Burnell Blanks (Urology): BPH a)nd urinary frequency. Dr. Copper (Cardiology): negative cardiac workup. Done 03/2016  Chest pain: Intermittent, chronic Onset over 1year ago  dizziness and chest pain with exertion. Resolves with rest. Associated with dehydration per cardiology (Dr. Earney Hamburg with [facility]). Had normal ECG, exercise tolerance test and CT cardiac scoring (done 03/2016). Exercises 2x/week, x1hour. Symptoms have not changed since evaluation by cardiology 03/2016. Reports he has not increased hydration as recommended due increased urinary frequency (secondary to BPH. use of flomax, managed by Dr. Burnell Blanks with [facility])  Colonoscopy done by Dr. Kalman Shan: normal per patient, hx of colon polyp, unknown date  Outpatient Medications Prior to Visit  Medication Sig Dispense Refill  . B Complex Vitamins (VITAMIN-B COMPLEX PO) Take by mouth.    . Omega-3 Fatty Acids (OMEGA 3 PO) Take 1 tablet by mouth daily.    . tamsulosin (FLOMAX) 0.4 MG CAPS capsule TAKE 1 CAPSULE BY MOUTH EVERY DAY 30 MINUTES BEFORE THE SAME MEAL  3  . aspirin EC 325 MG tablet Take 325 mg by mouth as needed for mild pain.    . naproxen (NAPROSYN) 500 MG tablet Take 1 tablet (500 mg total) by mouth 2 (two) times daily. (Patient not taking: Reported on 11/15/2017) 30 tablet 0  . PRESCRIPTION MEDICATION Take 1 tablet by mouth at bedtime. For over active bladder     No facility-administered medications prior to visit.     ROS Review of Systems  Constitutional: Negative.   HENT: Negative.   Respiratory: Negative for sputum production.   Cardiovascular: Positive for chest pain. Negative for palpitations and leg swelling.  Gastrointestinal: Negative.   Genitourinary: Positive for frequency.  Negative for dysuria, flank pain, hematuria and urgency.  Musculoskeletal: Negative.   Skin: Negative.   Neurological: Positive for dizziness. Negative for tingling, tremors, sensory change, speech change, focal weakness and headaches.  Psychiatric/Behavioral: Negative.     Objective:  BP 108/64   Pulse 65   Temp 97.9 F (36.6 C)   Ht 6' (1.829 m)   Wt 185 lb (83.9 kg)   SpO2 98%   BMI 25.09 kg/m   BP Readings from Last 3 Encounters:  11/15/17 108/64  03/06/16 112/62  01/26/15 145/86    Wt Readings from Last 3 Encounters:  11/15/17 185 lb (83.9 kg)  03/06/16 182 lb (82.6 kg)    Physical Exam  Constitutional: He is oriented to person, place, and time. No distress.  Neck: Normal range of motion. Neck supple. No thyromegaly present.  Cardiovascular: Normal rate and regular rhythm.  Pulmonary/Chest: Effort normal and breath sounds normal.  Musculoskeletal: He exhibits no edema.  Lymphadenopathy:    He has no cervical adenopathy.  Neurological: He is alert and oriented to person, place, and time.  Skin: Skin is warm and dry.  Psychiatric: He has a normal mood and affect. His behavior is normal. Thought content normal.  Vitals reviewed.   Lab Results  Component Value Date   WBC 5.1 01/20/2008   HGB 15.3 01/20/2008   HCT 43.7 01/20/2008   PLT 155 01/20/2008   GLUCOSE 81 01/20/2008   ALT 18 01/20/2008   AST 26 01/20/2008   NA 141 01/20/2008   K 4.0 01/20/2008   CL 107 01/20/2008  CREATININE 1.36 01/20/2008   BUN 11 01/20/2008   CO2 26 01/20/2008    Assessment & Plan:   Tierra was seen today for establish care.  Diagnoses and all orders for this visit:  Orthostatic dizziness -     Comprehensive metabolic panel; Future -     CBC with Differential/Platelet; Future -     Lipid panel; Future  Chest pain on exertion -     Comprehensive metabolic panel; Future -     CBC with Differential/Platelet; Future -     Lipid panel; Future   I am having Zachary Lawrence. maintain his PRESCRIPTION MEDICATION, aspirin EC, Omega-3 Fatty Acids (OMEGA 3 PO), naproxen, tamsulosin, and B Complex Vitamins (VITAMIN-B COMPLEX PO).  No orders of the defined types were placed in this encounter.   Follow-up: Return if symptoms worsen or fail to improve.  Wilfred Lacy, NP

## 2017-11-20 ENCOUNTER — Encounter: Payer: Self-pay | Admitting: Nurse Practitioner

## 2017-11-21 ENCOUNTER — Other Ambulatory Visit (INDEPENDENT_AMBULATORY_CARE_PROVIDER_SITE_OTHER): Payer: Medicare Other

## 2017-11-21 DIAGNOSIS — R42 Dizziness and giddiness: Secondary | ICD-10-CM

## 2017-11-21 DIAGNOSIS — R079 Chest pain, unspecified: Secondary | ICD-10-CM | POA: Diagnosis not present

## 2017-11-21 LAB — COMPREHENSIVE METABOLIC PANEL
ALT: 18 U/L (ref 0–53)
AST: 25 U/L (ref 0–37)
Albumin: 4.2 g/dL (ref 3.5–5.2)
Alkaline Phosphatase: 40 U/L (ref 39–117)
BUN: 17 mg/dL (ref 6–23)
CO2: 32 mEq/L (ref 19–32)
Calcium: 9.1 mg/dL (ref 8.4–10.5)
Chloride: 105 mEq/L (ref 96–112)
Creatinine, Ser: 1.17 mg/dL (ref 0.40–1.50)
GFR: 66.11 mL/min (ref >60.00)
Glucose, Bld: 105 mg/dL — ABNORMAL HIGH (ref 70–99)
Potassium: 4.9 mEq/L (ref 3.5–5.1)
SODIUM: 142 meq/L (ref 135–145)
TOTAL PROTEIN: 6.4 g/dL (ref 6.0–8.3)
Total Bilirubin: 1.6 mg/dL — ABNORMAL HIGH (ref 0.2–1.2)

## 2017-11-21 LAB — LIPID PANEL
Cholesterol: 192 mg/dL (ref 0–200)
HDL: 71.7 mg/dL (ref >39.00)
LDL Cholesterol: 110 mg/dL — ABNORMAL HIGH (ref 0–99)
NONHDL: 120.68
Total CHOL/HDL Ratio: 3
Triglycerides: 51 mg/dL (ref 0.0–149.0)
VLDL: 10.2 mg/dL (ref 0.0–40.0)

## 2017-11-21 LAB — CBC WITH DIFFERENTIAL/PLATELET
Basophils Absolute: 0 10*3/uL (ref 0.0–0.1)
Basophils Relative: 1 % (ref 0.0–3.0)
EOS PCT: 2.8 % (ref 0.0–5.0)
Eosinophils Absolute: 0.1 10*3/uL (ref 0.0–0.7)
HCT: 43.2 % (ref 39.0–52.0)
HEMOGLOBIN: 14.6 g/dL (ref 13.0–17.0)
Lymphocytes Relative: 26.4 % (ref 12.0–46.0)
Lymphs Abs: 1.2 10*3/uL (ref 0.7–4.0)
MCHC: 33.8 g/dL (ref 30.0–36.0)
MCV: 95.4 fl (ref 78.0–100.0)
MONO ABS: 0.4 10*3/uL (ref 0.1–1.0)
MONOS PCT: 8.7 % (ref 3.0–12.0)
Neutro Abs: 2.8 10*3/uL (ref 1.4–7.7)
Neutrophils Relative %: 61.1 % (ref 43.0–77.0)
Platelets: 182 10*3/uL (ref 150.0–400.0)
RBC: 4.53 Mil/uL (ref 4.22–5.81)
RDW: 13.1 % (ref 11.5–15.5)
WBC: 4.6 10*3/uL (ref 4.0–10.5)

## 2018-01-15 ENCOUNTER — Telehealth: Payer: Self-pay

## 2018-01-15 DIAGNOSIS — Z87898 Personal history of other specified conditions: Secondary | ICD-10-CM

## 2018-01-15 MED ORDER — SCOPOLAMINE 1 MG/3DAYS TD PT72
1.0000 | MEDICATED_PATCH | TRANSDERMAL | 0 refills | Status: DC
Start: 2018-01-15 — End: 2018-12-31

## 2018-01-15 NOTE — Telephone Encounter (Signed)
LMOVM stating this/thx dmf

## 2018-01-15 NOTE — Telephone Encounter (Signed)
CN-Patient states that he is going on a cruise on 3.1.19 and is requesting Scopolamine Patches to keep him from getting sick/plz advise/thx dmf

## 2018-01-17 NOTE — Telephone Encounter (Signed)
No substitute for this med. proceed with PA.

## 2018-01-17 NOTE — Telephone Encounter (Signed)
Received a massage from [facility] request PA for Scopolamine (transferm-scop) patch. Please advise, can we change to something else or start PA?

## 2018-01-17 NOTE — Telephone Encounter (Signed)
PA started,wating for the result.   Zachary Lawrence (Key: [id number]) [phone]

## 2018-01-20 NOTE — Telephone Encounter (Signed)
PA for Tranderm-Scop started, waiting for PA result  Zachary Lawrence (Key: [id number]) Transderm-Scop (1.5 MG) 1MG /3DAYS 72 hr patches

## 2018-01-20 NOTE — Telephone Encounter (Signed)
PA from [facility] is approved, infrom [facility] of PA result.

## 2018-01-21 NOTE — Telephone Encounter (Signed)
Spoke with Jenny Reichmann and let him know that PA is approved. [facility] will call the pt.

## 2018-02-20 ENCOUNTER — Telehealth: Payer: Self-pay | Admitting: Nurse Practitioner

## 2018-02-20 DIAGNOSIS — Z20828 Contact with and (suspected) exposure to other viral communicable diseases: Secondary | ICD-10-CM

## 2018-02-20 MED ORDER — OSELTAMIVIR PHOSPHATE 75 MG PO CAPS
75.0000 mg | ORAL_CAPSULE | Freq: Every day | ORAL | 0 refills | Status: DC
Start: 2018-02-20 — End: 2019-01-14

## 2018-02-20 NOTE — Telephone Encounter (Signed)
Pt came in stating his spouse was dx with flu 02/19/18. Request med to prevent flu.   Rx sent to [facility] for the pt. Decline flu shot at this time.

## 2018-02-25 NOTE — Progress Notes (Signed)
Subjective:   Zachary Lawrence. is a 67 y.o. male who presents for an Initial Medicare Annual Wellness Visit. Works full time as Cabin crew.   Review of Systems   No ROS.  Medicare Wellness Visit. Additional risk factors are reflected in the social history.  Cardiac Risk Factors include: advanced age (>58men, >57 women) Sleep patterns: "I sleep great!" Home Safety/Smoke Alarms: Feels safe in home. Smoke alarms in place.  Living environment; residence and Firearm Safety: Lives alone. 2 story home. No issues with stairs. Seat Belt Safety/Bike Helmet: Wears seat belt.  Male:   CCS- per pt-done by Dr.Buccini-normal-records requested. Date unknown. Pt states just received letter that he is due 03/2018 and states he will schedule. PSA- No results found for: PSA      Objective:    Today's Vitals   02/26/18 0935  BP: (!) 120/58  Pulse: 70  SpO2: 99%  Weight: 185 lb (83.9 kg)  Height: 6' (1.829 m)   Body mass index is 25.09 kg/m.  Advanced Directives 02/26/2018 01/26/2015  Does Patient Have a Medical Advance Directive? No No  Would patient like information on creating a medical advance directive? Yes (MAU/Ambulatory/Procedural Areas - Information given) No - patient declined information    Current Medications (verified) Outpatient Encounter Medications as of 02/26/2018  Medication Sig  . B Complex Vitamins (VITAMIN-B COMPLEX PO) Take by mouth.  . Omega-3 Fatty Acids (OMEGA 3 PO) Take 1 tablet by mouth daily.  Marland Kitchen oseltamivir (TAMIFLU) 75 MG capsule Take 1 capsule (75 mg total) by mouth daily.  Marland Kitchen scopolamine (TRANSDERM-SCOP) 1 MG/3DAYS Place 1 patch (1.5 mg total) onto the skin every 3 (three) days.  . tamsulosin (FLOMAX) 0.4 MG CAPS capsule TAKE 1 CAPSULE BY MOUTH EVERY DAY 30 MINUTES BEFORE THE SAME MEAL  . aspirin EC 325 MG tablet Take 325 mg by mouth as needed for mild pain.  . naproxen (NAPROSYN) 500 MG tablet Take 1 tablet (500 mg total) by mouth 2 (two) times daily. (Patient  not taking: Reported on 11/15/2017)  . [DISCONTINUED] PRESCRIPTION MEDICATION Take 1 tablet by mouth at bedtime. For over active bladder   No facility-administered encounter medications on file as of 02/26/2018.     Allergies (verified) Patient has no known allergies.   History: Past Medical History:  Diagnosis Date  . GERD (gastroesophageal reflux disease)   . Hepatitis   . Overactive bladder    Past Surgical History:  Procedure Laterality Date  . CHOLECYSTECTOMY  1992  . TONSILLECTOMY     Family History  Problem Relation Age of Onset  . CAD Father 64       CABG/Stent  . Ovarian cancer Sister   . Alzheimer's disease Mother   . Heart disease Paternal Uncle   . Cancer Paternal Uncle        prostate cancer   Social History   Socioeconomic History  . Marital status: Legally Separated    Spouse name: None  . Number of children: 2  . Years of education: None  . Highest education level: None  Social Needs  . Financial resource strain: None  . Food insecurity - worry: None  . Food insecurity - inability: None  . Transportation needs - medical: None  . Transportation needs - non-medical: None  Occupational History  . Occupation: realtor  Tobacco Use  . Smoking status: Former Smoker    Types: Cigarettes    Last attempt to quit: 12/11/1975    Years since quitting: 42.2  .  Smokeless tobacco: Never Used  Substance and Sexual Activity  . Alcohol use: Yes    Comment: social  . Drug use: No  . Sexual activity: Yes  Other Topics Concern  . None  Social History Narrative   Lives alone.      Tobacco Counseling Counseling given: Not Answered   Clinical Intake: Pain : No/denies pain     Activities of Daily Living In your present state of health, do you have any difficulty performing the following activities: 02/26/2018  Hearing? N  Vision? Y  Comment wears glasses for reading  Difficulty concentrating or making decisions? N  Walking or climbing stairs? N    Dressing or bathing? N  Doing errands, shopping? N  Preparing Food and eating ? N  Using the Toilet? N  In the past six months, have you accidently leaked urine? N  Do you have problems with loss of bowel control? N  Managing your Medications? N  Managing your Finances? N  Housekeeping or managing your Housekeeping? N  Some recent data might be hidden     Immunizations and Lawrence Maintenance  There is no immunization history on file for this patient. Lawrence Maintenance Due  Topic Date Due  . Hepatitis C Screening  Jan 15, 1951  . TETANUS/TDAP  01/01/1970  . COLONOSCOPY  01/01/2001  . PNA vac Low Risk Adult (1 of 2 - PCV13) 01/02/2016    Patient Care Team: Nche, Charlene Brooke, NP as PCP - General (Internal Medicine) Ronald Lobo, MD as Consulting Physician (Gastroenterology)  Indicate any recent Medical Services you may have received from other than [facility] providers in the past year (date may be approximate).     Assessment:   This is a routine wellness examination for Zachary Lawrence. Physical assessment deferred to PCP.  Hearing/Vision screen  Visual Acuity Screening   Right eye Left eye Both eyes  Without correction: 20/40 20/40 20/40   With correction:     Comments: Pt instructed to schedule appointment with eye doctor. Agrees.  Hearing Screening Comments: Able to hear conversational tones w/o difficulty. No issues reported.  Passed whisper test.   Dietary issues and exercise activities discussed: Current Exercise Habits: Structured exercise class, Type of exercise: stretching;strength training/weights;calisthenics, Time (Minutes): 60, Frequency (Times/Week): 3, Weekly Exercise (Minutes/Week): 180, Intensity: Moderate Diet (meal preparation, eat out, water intake, caffeinated beverages, dairy products, fruits and vegetables): in general, a "healthy" diet  , well balanced   Goals    . Complete colonoscopy    . Maintain healthy active lifestyle. (pt-stated)    . Visit eye  doctor      Depression Screen PHQ 2/9 Scores 02/26/2018 11/15/2017  PHQ - 2 Score 0 0    Fall Risk Fall Risk  02/26/2018 11/15/2017  Falls in the past year? No No    Cognitive Function:Ad8 score reviewed for issues:  Issues making decisions:no  Less interest in hobbies / activities:  Repeats questions, stories (family complaining):no  Trouble using ordinary gadgets (microwave, computer, phone):no  Forgets the month or year: no  Mismanaging finances: no  Remembering appts:no  Daily problems with thinking and/or memory:no Ad8 score is=0        Screening Tests Lawrence Maintenance  Topic Date Due  . Hepatitis C Screening  September 01, 1951  . TETANUS/TDAP  01/01/1970  . COLONOSCOPY  01/01/2001  . PNA vac Low Risk Adult (1 of 2 - PCV13) 01/02/2016  . INFLUENZA VACCINE  07/18/2018 (Originally 07/10/2017)     Plan:   Follow up with PCP  as scheduled  Continue to eat heart healthy diet (full of fruits, vegetables, whole grains, lean protein, water--limit salt, fat, and sugar intake).  Continue exercising  Schedule eye doctor appointment.  Schedule colonoscopy.  Schedule appointment with urology.  See you next year!   I have personally reviewed and noted the following in the patient's chart:   . Medical and social history . Use of alcohol, tobacco or illicit drugs  . Current medications and supplements . Functional ability and status . Nutritional status . Physical activity . Advanced directives . List of other physicians . Hospitalizations, surgeries, and ER visits in previous 12 months . Vitals . Screenings to include cognitive, depression, and falls . Referrals and appointments  In addition, I have reviewed and discussed with patient certain preventive protocols, quality metrics, and best practice recommendations. A written personalized care plan for preventive services as well as general preventive Lawrence recommendations were provided to patient.     Shela Nevin, [place]   02/26/2018

## 2018-02-26 ENCOUNTER — Encounter: Payer: Self-pay | Admitting: Behavioral Health

## 2018-02-26 ENCOUNTER — Ambulatory Visit (INDEPENDENT_AMBULATORY_CARE_PROVIDER_SITE_OTHER): Payer: Medicare Other | Admitting: Behavioral Health

## 2018-02-26 VITALS — BP 120/58 | HR 70 | Ht 72.0 in | Wt 185.0 lb

## 2018-02-26 DIAGNOSIS — Z0000 Encounter for general adult medical examination without abnormal findings: Secondary | ICD-10-CM | POA: Diagnosis not present

## 2018-02-26 NOTE — Patient Instructions (Signed)
Continue to eat heart healthy diet (full of fruits, vegetables, whole grains, lean protein, water--limit salt, fat, and sugar intake).  Continue exercising  Schedule eye doctor appointment.  Schedule colonoscopy.  Schedule appointment with urology.  See you next year!   [name] , Thank you for taking time to come for your Medicare Wellness Visit. I appreciate your ongoing commitment to your health goals. Please review the following plan we discussed and let me know if I can assist you in the future.   These are the goals we discussed: Goals    . Complete colonoscopy    . Maintain healthy active lifestyle. (pt-stated)    . Visit eye doctor       This is a list of the screening recommended for you and due dates:  Health Maintenance  Topic Date Due  .  Hepatitis C: One time screening is recommended by Center for Disease Control  (CDC) for  adults born from [birth year] through 1965.   [birth year]  . Tetanus Vaccine  01/01/1970  . Colon Cancer Screening  01/01/2001  . Pneumonia vaccines (1 of 2 - PCV13) 01/02/2016  . Flu Shot  07/18/2018*  *Topic was postponed. The date shown is not the original due date.    Health Maintenance, Male A healthy lifestyle and preventive care is important for your health and wellness. Ask your health care provider about what schedule of regular examinations is right for you. What should I know about weight and diet? Eat a Healthy Diet  Eat plenty of vegetables, fruits, whole grains, low-fat dairy products, and lean protein.  Do not eat a lot of foods high in solid fats, added sugars, or salt.  Maintain a Healthy Weight Regular exercise can help you achieve or maintain a healthy weight. You should:  Do at least 150 minutes of exercise each week. The exercise should increase your heart rate and make you sweat (moderate-intensity exercise).  Do strength-training exercises at least twice a week.  Watch Your Levels of Cholesterol and Blood  Lipids  Have your blood tested for lipids and cholesterol every 5 years starting at 67 years of age. If you are at high risk for heart disease, you should start having your blood tested when you are 67 years old. You may need to have your cholesterol levels checked more often if: ? Your lipid or cholesterol levels are high. ? You are older than 67 years of age. ? You are at high risk for heart disease.  What should I know about cancer screening? Many types of cancers can be detected early and may often be prevented. Lung Cancer  You should be screened every year for lung cancer if: ? You are a current smoker who has smoked for at least 30 years. ? You are a former smoker who has quit within the past 15 years.  Talk to your health care provider about your screening options, when you should start screening, and how often you should be screened.  Colorectal Cancer  Routine colorectal cancer screening usually begins at 67 years of age and should be repeated every 5-10 years until you are 67 years old. You may need to be screened more often if early forms of precancerous polyps or small growths are found. Your health care provider may recommend screening at an earlier age if you have risk factors for colon cancer.  Your health care provider may recommend using home test kits to check for hidden blood in the stool.  A small  camera at the end of a tube can be used to examine your colon (sigmoidoscopy or colonoscopy). This checks for the earliest forms of colorectal cancer.  Prostate and Testicular Cancer  Depending on your age and overall health, your health care provider may do certain tests to screen for prostate and testicular cancer.  Talk to your health care provider about any symptoms or concerns you have about testicular or prostate cancer.  Skin Cancer  Check your skin from head to toe regularly.  Tell your health care provider about any new moles or changes in moles, especially  if: ? There is a change in a mole's size, shape, or color. ? You have a mole that is larger than a pencil eraser.  Always use sunscreen. Apply sunscreen liberally and repeat throughout the day.  Protect yourself by wearing long sleeves, pants, a wide-brimmed hat, and sunglasses when outside.  What should I know about heart disease, diabetes, and high blood pressure?  If you are 65-91 years of age, have your blood pressure checked every 3-5 years. If you are 69 years of age or older, have your blood pressure checked every year. You should have your blood pressure measured twice-once when you are at a hospital or clinic, and once when you are not at a hospital or clinic. Record the average of the two measurements. To check your blood pressure when you are not at a hospital or clinic, you can use: ? An automated blood pressure machine at a pharmacy. ? A home blood pressure monitor.  Talk to your health care provider about your target blood pressure.  If you are between 75-56 years old, ask your health care provider if you should take aspirin to prevent heart disease.  Have regular diabetes screenings by checking your fasting blood sugar level. ? If you are at a normal weight and have a low risk for diabetes, have this test once every three years after the age of 63. ? If you are overweight and have a high risk for diabetes, consider being tested at a younger age or more often.  A one-time screening for abdominal aortic aneurysm (AAA) by ultrasound is recommended for men aged 43-75 years who are current or former smokers. What should I know about preventing infection? Hepatitis B If you have a higher risk for hepatitis B, you should be screened for this virus. Talk with your health care provider to find out if you are at risk for hepatitis B infection. Hepatitis C Blood testing is recommended for:  Everyone born from [birth year] through 1965.  Anyone with known risk factors for hepatitis  C.  Sexually Transmitted Diseases (STDs)  You should be screened each year for STDs including gonorrhea and chlamydia if: ? You are sexually active and are younger than 67 years of age. ? You are older than 67 years of age and your health care provider tells you that you are at risk for this type of infection. ? Your sexual activity has changed since you were last screened and you are at an increased risk for chlamydia or gonorrhea. Ask your health care provider if you are at risk.  Talk with your health care provider about whether you are at high risk of being infected with HIV. Your health care provider may recommend a prescription medicine to help prevent HIV infection.  What else can I do?  Schedule regular health, dental, and eye exams.  Stay current with your vaccines (immunizations).  Do not use any tobacco  products, such as cigarettes, chewing tobacco, and e-cigarettes. If you need help quitting, ask your health care provider.  Limit alcohol intake to no more than 2 drinks per day. One drink equals 12 ounces of beer, 5 ounces of wine, or 1 ounces of hard liquor.  Do not use street drugs.  Do not share needles.  Ask your health care provider for help if you need support or information about quitting drugs.  Tell your health care provider if you often feel depressed.  Tell your health care provider if you have ever been abused or do not feel safe at home. This information is not intended to replace advice given to you by your health care provider. Make sure you discuss any questions you have with your health care provider. Document Released: 05/24/2008 Document Revised: 07/25/2016 Document Reviewed: 08/30/2015 Elsevier Interactive Patient Education  [employer].

## 2018-02-26 NOTE — Progress Notes (Signed)
Medical screening examination/treatment/procedure(s) were performed by the Wellness Coach, RN. As primary care provider I was immediately available for consulation/collaboration. I agree with above documentation. Charlotte Nche, AGNP-C 

## 2018-03-05 ENCOUNTER — Ambulatory Visit: Payer: Medicare Other | Admitting: Behavioral Health

## 2018-03-31 DIAGNOSIS — N401 Enlarged prostate with lower urinary tract symptoms: Secondary | ICD-10-CM | POA: Diagnosis not present

## 2018-03-31 DIAGNOSIS — N138 Other obstructive and reflux uropathy: Secondary | ICD-10-CM | POA: Diagnosis not present

## 2018-03-31 DIAGNOSIS — R339 Retention of urine, unspecified: Secondary | ICD-10-CM | POA: Diagnosis not present

## 2018-03-31 DIAGNOSIS — R358 Other polyuria: Secondary | ICD-10-CM | POA: Diagnosis not present

## 2018-04-18 ENCOUNTER — Encounter (HOSPITAL_BASED_OUTPATIENT_CLINIC_OR_DEPARTMENT_OTHER): Payer: Self-pay | Admitting: *Deleted

## 2018-04-18 ENCOUNTER — Other Ambulatory Visit: Payer: Self-pay

## 2018-04-18 ENCOUNTER — Emergency Department (HOSPITAL_BASED_OUTPATIENT_CLINIC_OR_DEPARTMENT_OTHER): Payer: Medicare Other

## 2018-04-18 ENCOUNTER — Emergency Department (HOSPITAL_BASED_OUTPATIENT_CLINIC_OR_DEPARTMENT_OTHER)
Admission: EM | Admit: 2018-04-18 | Discharge: 2018-04-18 | Disposition: A | Discharge status: Home / Self Care | Payer: Medicare Other | Attending: Emergency Medicine | Admitting: Emergency Medicine

## 2018-04-18 DIAGNOSIS — S8011XA Contusion of right lower leg, initial encounter: Secondary | ICD-10-CM

## 2018-04-18 DIAGNOSIS — Y998 Other external cause status: Secondary | ICD-10-CM | POA: Insufficient documentation

## 2018-04-18 DIAGNOSIS — Y9301 Activity, walking, marching and hiking: Secondary | ICD-10-CM | POA: Diagnosis not present

## 2018-04-18 DIAGNOSIS — S80811A Abrasion, right lower leg, initial encounter: Secondary | ICD-10-CM

## 2018-04-18 DIAGNOSIS — W228XXA Striking against or struck by other objects, initial encounter: Secondary | ICD-10-CM | POA: Diagnosis not present

## 2018-04-18 DIAGNOSIS — Z87891 Personal history of nicotine dependence: Secondary | ICD-10-CM | POA: Insufficient documentation

## 2018-04-18 DIAGNOSIS — Y929 Unspecified place or not applicable: Secondary | ICD-10-CM | POA: Insufficient documentation

## 2018-04-18 DIAGNOSIS — S8991XA Unspecified injury of right lower leg, initial encounter: Secondary | ICD-10-CM | POA: Diagnosis not present

## 2018-04-18 DIAGNOSIS — Z79899 Other long term (current) drug therapy: Secondary | ICD-10-CM | POA: Insufficient documentation

## 2018-04-18 DIAGNOSIS — Z23 Encounter for immunization: Secondary | ICD-10-CM | POA: Diagnosis not present

## 2018-04-18 MED ORDER — TETANUS-DIPHTH-ACELL PERTUSSIS 5-2.5-18.5 LF-MCG/0.5 IM SUSP
0.5000 mL | Freq: Once | INTRAMUSCULAR | Status: AC
  Administered 2018-04-18: 0.5 mL via INTRAMUSCULAR
  Filled 2018-04-18: qty 0.5

## 2018-04-18 MED ORDER — BACITRACIN ZINC 500 UNIT/GM EX OINT: TOPICAL_OINTMENT | Freq: Two times a day (BID) | CUTANEOUS | Status: DC

## 2018-04-18 NOTE — Discharge Instructions (Signed)
Please read and follow all provided instructions.  Your diagnoses today include:  1. Contusion of right lower leg, initial encounter   2. Abrasion of right lower leg, initial encounter     Tests performed today include:  An x-ray of the affected area - does NOT show any broken bones  Vital signs. See below for your results today.   Medications prescribed:   None  Take any prescribed medications only as directed.  Home care instructions:   Follow any educational materials contained in this packet  Follow R.I.C.E. Protocol:  R - rest your injury   I  - use ice on injury without applying directly to skin  C - compress injury with bandage or splint  E - elevate the injury as much as possible  Follow-up instructions: Please follow-up with your primary care provider as needed.   Return instructions:   Please return if your toes or feet are numb or tingling, appear gray or blue, or you have severe pain (also elevate the leg and loosen splint or wrap if you were given one)  Please return to the Emergency Department if you experience worsening symptoms.   Please return if you have any other emergent concerns.  Additional Information:  Your vital signs today were: BP 119/68 (BP Location: Left Arm)    Pulse 78    Temp 98.5 F (36.9 C) (Oral)    Resp 16    Ht 6' (1.829 m)    Wt 83.9 kg (185 lb)    SpO2 100%    BMI 25.09 kg/m  If your blood pressure (BP) was elevated above 135/85 this visit, please have this repeated by your doctor within one month. --------------

## 2018-04-18 NOTE — ED Notes (Signed)
Patient transported to X-ray 

## 2018-04-18 NOTE — ED Provider Notes (Signed)
[facility] EMERGENCY DEPARTMENT Provider Note   CSN: [id number] Arrival date & time: 04/18/18  2128     History   Chief Complaint Chief Complaint  Patient presents with  . Leg Injury    HPI Zachary Lawrence. is a 67 y.o. male.  Patient presents the emergency department with right anterior leg wound which was sustained acutely just prior to arrival when he walked into a trailer hitch.  Patient's wife thinks that he punctured a varicose vein and he had active bleeding which prompted emergency department visit.  Patient has pain around the site but bleeding has improved.  No knee or ankle pain.  No history of blood thinners.  Tetanus is out of date.  The onset of this condition was acute. The course is improved. Aggravating factors: palpation. Alleviating factors: none.       Past Medical History:  Diagnosis Date  . GERD (gastroesophageal reflux disease)   . Hepatitis   . Overactive bladder     Patient Active Problem List   Diagnosis Date Noted  . Precordial chest pain 03/06/2016  . Dizziness 03/06/2016  . SOB (shortness of breath) 03/06/2016    Past Surgical History:  Procedure Laterality Date  . CHOLECYSTECTOMY  1992  . TONSILLECTOMY          Home Medications    Prior to Admission medications   Medication Sig Start Date End Date Taking? Authorizing Provider  aspirin EC 325 MG tablet Take 325 mg by mouth as needed for mild pain.    [provider]  B Complex Vitamins (VITAMIN-B COMPLEX PO) Take by mouth.    [provider]  naproxen (NAPROSYN) 500 MG tablet Take 1 tablet (500 mg total) by mouth 2 (two) times daily. Patient not taking: Reported on 11/15/2017 01/26/15   Comer Locket, PA-C  Omega-3 Fatty Acids (OMEGA 3 PO) Take 1 tablet by mouth daily.    [provider]  oseltamivir (TAMIFLU) 75 MG capsule Take 1 capsule (75 mg total) by mouth daily. 02/20/18   Nche, Charlene Brooke, NP  scopolamine (TRANSDERM-SCOP) 1  MG/3DAYS Place 1 patch (1.5 mg total) onto the skin every 3 (three) days. 01/15/18   Nche, Charlene Brooke, NP  tamsulosin (FLOMAX) 0.4 MG CAPS capsule TAKE 1 CAPSULE BY MOUTH EVERY DAY 30 MINUTES BEFORE THE SAME MEAL 09/30/17   [provider]    Family History Family History  Problem Relation Age of Onset  . CAD Father 40       CABG/Stent  . Ovarian cancer Sister   . Alzheimer's disease Mother   . Heart disease Paternal Uncle   . Cancer Paternal Uncle        prostate cancer    Social History Social History   Tobacco Use  . Smoking status: Former Smoker    Types: Cigarettes    Last attempt to quit: 12/11/1975    Years since quitting: 42.3  . Smokeless tobacco: Never Used  Substance Use Topics  . Alcohol use: Yes    Comment: social  . Drug use: No     Allergies   Patient has no known allergies.   Review of Systems Review of Systems  Constitutional: Negative for activity change.  Musculoskeletal: Positive for arthralgias. Negative for back pain, gait problem, joint swelling and neck pain.  Skin: Positive for wound.  Neurological: Negative for weakness and numbness.     Physical Exam Updated Vital Signs BP 119/68 (BP Location: Left Arm)   Pulse  78   Temp 98.5 F (36.9 C) (Oral)   Resp 16   Ht 6' (1.829 m)   Wt 83.9 kg (185 lb)   SpO2 100%   BMI 25.09 kg/m   Physical Exam  Constitutional: He appears well-developed and well-nourished.  HENT:  Head: Normocephalic and atraumatic.  Eyes: Conjunctivae are normal.  Neck: Normal range of motion. Neck supple.  Cardiovascular: Normal pulses. Exam reveals no decreased pulses.  Musculoskeletal: He exhibits tenderness. He exhibits no edema.  There is an approximately 8 mm non-gaping wound to the anterior right mid tibia.  No active bleeding.  Wound clean.  Base appears clean.  There is associated point tenderness.  Mild ecchymosis.  Neurological: He is alert. No sensory deficit.  Motor, sensation, and vascular  distal to the injury is fully intact.   Skin: Skin is warm and dry.  Psychiatric: He has a normal mood and affect.  Nursing note and vitals reviewed.    ED Treatments / Results  Labs (all labs ordered are listed, but only abnormal results are displayed) Labs Reviewed - No data to display  EKG None  Radiology Dg Tibia/fibula Right  Result Date: 04/18/2018 CLINICAL DATA:  67 year old male with trauma to the right lower extremity. EXAM: RIGHT TIBIA AND FIBULA - 2 VIEW COMPARISON:  None. FINDINGS: There is no acute fracture or dislocation. There are degenerative changes of the right knee with mild depression of the lateral tibial plateau, likely chronic. Correlation with point tenderness recommended. The soft tissues appear unremarkable. IMPRESSION: 1. No acute fracture or dislocation. 2. 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 Electronically Signed   By: Anner Crete M.D.   On: 04/18/2018 22:19    Procedures Procedures (including critical care time)  Medications Ordered in ED Medications  Tdap (BOOSTRIX) injection 0.5 mL (0.5 mLs Intramuscular Given 04/18/18 2155)     Initial Impression / Assessment and Plan / ED Course  I have reviewed the triage vital signs and the nursing notes.  Pertinent labs & imaging results that were available during my care of the patient were reviewed by me and considered in my medical decision making (see chart for details).     Patient seen and examined.  X-ray ordered.  Tetanus updated.  Discussed need for good wound care with patient.  Discussed signs and symptoms to return.  Vital signs reviewed and are as follows: BP 119/68 (BP Location: Left Arm)   Pulse 78   Temp 98.5 F (36.9 C) (Oral)   Resp 16   Ht 6' (1.829 m)   Wt 83.9 kg (185 lb)   SpO2 100%   BMI 25.09 kg/m   10:53 PM wound bandage.  X-ray is negative.  She has chronic appearing depression at the tibial plateau however patient is not tender  here do not suspect this represents acute injury.  Pt urged to return with worsening pain, worsening swelling, expanding area of redness or streaking up extremity, fever, or any other concerns. Pt verbalizes understanding and agrees with plan.   Final Clinical Impressions(s) / ED Diagnoses   Final diagnoses:  Contusion of right lower leg, initial encounter  Abrasion of right lower leg, initial encounter   Patient with contusion and abrasion of the right lower leg.  Patient has a small laceration that does not require repair.  X-ray imaging is negative.  Tetanus up-to-date.  Patient counseled on supportive care at home, signs and symptoms to return.  ED Discharge Orders  None       Carlisle Cater, PA-C 04/18/18 Troy, Fenton, DO 04/18/18 2324

## 2018-04-18 NOTE — ED Triage Notes (Signed)
Pt c/o right leg puncture wound by metal x 15 mins ago

## 2018-04-29 DIAGNOSIS — H353131 Nonexudative age-related macular degeneration, bilateral, early dry stage: Secondary | ICD-10-CM | POA: Diagnosis not present

## 2018-04-29 DIAGNOSIS — H04123 Dry eye syndrome of bilateral lacrimal glands: Secondary | ICD-10-CM | POA: Diagnosis not present

## 2018-06-09 DIAGNOSIS — Z86010 Personal history of colonic polyps: Secondary | ICD-10-CM | POA: Diagnosis not present

## 2018-06-09 LAB — HM COLONOSCOPY

## 2018-07-15 DIAGNOSIS — N138 Other obstructive and reflux uropathy: Secondary | ICD-10-CM | POA: Diagnosis not present

## 2018-07-15 DIAGNOSIS — N401 Enlarged prostate with lower urinary tract symptoms: Secondary | ICD-10-CM | POA: Diagnosis not present

## 2018-11-03 DIAGNOSIS — L570 Actinic keratosis: Secondary | ICD-10-CM | POA: Diagnosis not present

## 2018-11-03 DIAGNOSIS — Z08 Encounter for follow-up examination after completed treatment for malignant neoplasm: Secondary | ICD-10-CM | POA: Diagnosis not present

## 2018-11-03 DIAGNOSIS — Z85828 Personal history of other malignant neoplasm of skin: Secondary | ICD-10-CM | POA: Diagnosis not present

## 2018-11-03 DIAGNOSIS — L820 Inflamed seborrheic keratosis: Secondary | ICD-10-CM | POA: Diagnosis not present

## 2018-11-11 DIAGNOSIS — N138 Other obstructive and reflux uropathy: Secondary | ICD-10-CM | POA: Diagnosis not present

## 2018-11-11 DIAGNOSIS — N401 Enlarged prostate with lower urinary tract symptoms: Secondary | ICD-10-CM | POA: Diagnosis not present

## 2018-12-31 ENCOUNTER — Telehealth: Payer: Self-pay | Admitting: Nurse Practitioner

## 2018-12-31 DIAGNOSIS — Z87898 Personal history of other specified conditions: Secondary | ICD-10-CM

## 2018-12-31 MED ORDER — SCOPOLAMINE 1 MG/3DAYS TD PT72: 1.0000 | MEDICATED_PATCH | TRANSDERMAL | 0 refills | Status: DC

## 2018-12-31 NOTE — Addendum Note (Signed)
Addended byShawnie Pons on: 12/31/2018 09:54 AM   Modules accepted: Orders

## 2018-12-31 NOTE — Telephone Encounter (Signed)
Rx sent to [facility] as pt requested. Pt aware to make an CPE with Nche.

## 2018-12-31 NOTE — Telephone Encounter (Signed)
Ok to refill scopolamine patch. Needs to schedule his annual appt with me

## 2018-12-31 NOTE — Telephone Encounter (Signed)
Charlotte please advise, we gave him scopolamine (TRANSDERM-SCOP) 1 MG/3DAYS on 01/15/2018.

## 2018-12-31 NOTE — Telephone Encounter (Signed)
Patient needs a new refill on Patch that you put behind your ear, last time he got it was last year for a cruise and he is going on one again. He already contacted the pharmacy and said they had sent in a refill request but haven't received anything yet

## 2019-01-14 ENCOUNTER — Encounter: Payer: Self-pay | Admitting: Nurse Practitioner

## 2019-01-14 ENCOUNTER — Ambulatory Visit (INDEPENDENT_AMBULATORY_CARE_PROVIDER_SITE_OTHER): Payer: Medicare Other | Admitting: Nurse Practitioner

## 2019-01-14 VITALS — BP 102/62 | HR 63 | Temp 97.7°F | Ht 72.0 in | Wt 186.8 lb

## 2019-01-14 DIAGNOSIS — Z136 Encounter for screening for cardiovascular disorders: Secondary | ICD-10-CM

## 2019-01-14 DIAGNOSIS — Z13220 Encounter for screening for lipoid disorders: Secondary | ICD-10-CM

## 2019-01-14 DIAGNOSIS — Z1159 Encounter for screening for other viral diseases: Secondary | ICD-10-CM | POA: Diagnosis not present

## 2019-01-14 DIAGNOSIS — Z125 Encounter for screening for malignant neoplasm of prostate: Secondary | ICD-10-CM | POA: Diagnosis not present

## 2019-01-14 DIAGNOSIS — R739 Hyperglycemia, unspecified: Secondary | ICD-10-CM

## 2019-01-14 DIAGNOSIS — J00 Acute nasopharyngitis [common cold]: Secondary | ICD-10-CM | POA: Diagnosis not present

## 2019-01-14 LAB — POCT INFLUENZA A/B
Influenza A, POC: NEGATIVE
Influenza B, POC: NEGATIVE

## 2019-01-14 MED ORDER — IPRATROPIUM BROMIDE 0.03 % NA SOLN: 2.0000 | Freq: Two times a day (BID) | NASAL | 0 refills | Status: DC

## 2019-01-14 MED ORDER — BENZONATATE 100 MG PO CAPS: 100.0000 mg | ORAL_CAPSULE | Freq: Three times a day (TID) | ORAL | 0 refills | Status: DC | PRN

## 2019-01-14 MED ORDER — CHLORPHEN-PE-ACETAMINOPHEN 4-10-325 MG PO TABS: 1.0000 | ORAL_TABLET | Freq: Two times a day (BID) | ORAL | 0 refills | Status: AC

## 2019-01-14 MED ORDER — DM-GUAIFENESIN ER 30-600 MG PO TB12: 1.0000 | ORAL_TABLET | Freq: Two times a day (BID) | ORAL | 0 refills | Status: DC | PRN

## 2019-01-14 NOTE — Progress Notes (Signed)
Subjective:  Patient ID: Zachary Bard., male    DOB: 1951-08-07  Age: 68 y.o. MRN: [id number]  CC: Cough (pt is c/o of coughing,sore throat,bodyache--going on 3 days. took otc. was on the cruise on 01/04/2019-01/11/2019. )  Cough  This is a new problem. The current episode started in the past 7 days. The problem has been unchanged. The cough is non-productive. Associated symptoms include chills, headaches, myalgias, nasal congestion, postnasal drip, rhinorrhea and a sore throat. Pertinent negatives include no chest pain, fever, shortness of breath or wheezing. The symptoms are aggravated by lying down and cold air. He has tried OTC cough suppressant for the symptoms. The treatment provided no relief.  no GI symptoms. Traveled to [place] via [place], [place] and [place], [place]. Girlfriend with similar symptoms, she was negative for influenza.  Reviewed past Medical, Social and Family history today.  Outpatient Medications Prior to Visit  Medication Sig Dispense Refill  . aspirin EC 325 MG tablet Take 325 mg by mouth as needed for mild pain.    . B Complex Vitamins (VITAMIN-B COMPLEX PO) Take by mouth.    Marland Kitchen OVER THE COUNTER MEDICATION Aerie's- otc for eyes.    . tamsulosin (FLOMAX) 0.4 MG CAPS capsule TAKE 1 CAPSULE BY MOUTH EVERY DAY 30 MINUTES BEFORE THE SAME MEAL  3  . Omega-3 Fatty Acids (OMEGA 3 PO) Take 1 tablet by mouth daily.    . naproxen (NAPROSYN) 500 MG tablet Take 1 tablet (500 mg total) by mouth 2 (two) times daily. (Patient not taking: Reported on 11/15/2017) 30 tablet 0  . oseltamivir (TAMIFLU) 75 MG capsule Take 1 capsule (75 mg total) by mouth daily. (Patient not taking: Reported on 01/14/2019) 10 capsule 0  . scopolamine (TRANSDERM-SCOP) 1 MG/3DAYS Place 1 patch (1.5 mg total) onto the skin every 3 (three) days. (Patient not taking: Reported on 01/14/2019) 4 patch 0   No facility-administered medications prior to visit.     ROS See HPI  Objective:  BP 102/62   Pulse 63    Temp 97.7 F (36.5 C) (Oral)   Ht 6' (1.829 m)   Wt 186 lb 12.8 oz (84.7 kg)   SpO2 97%   BMI 25.33 kg/m   BP Readings from Last 3 Encounters:  01/14/19 102/62  04/18/18 119/68  02/26/18 (!) 120/58    Wt Readings from Last 3 Encounters:  01/14/19 186 lb 12.8 oz (84.7 kg)  04/18/18 185 lb (83.9 kg)  02/26/18 185 lb (83.9 kg)    Physical Exam Vitals signs reviewed.  Constitutional:      General: He is not in acute distress. HENT:     Right Ear: Tympanic membrane, ear canal and external ear normal.     Left Ear: Tympanic membrane and ear canal normal.     Nose: Mucosal edema and rhinorrhea present.     Right Sinus: Maxillary sinus tenderness and frontal sinus tenderness present.     Left Sinus: Maxillary sinus tenderness and frontal sinus tenderness present.     Mouth/Throat:     Pharynx: Uvula midline. Posterior oropharyngeal erythema present. No oropharyngeal exudate.  Eyes:     General: No scleral icterus. Neck:     Musculoskeletal: Normal range of motion and neck supple.  Cardiovascular:     Rate and Rhythm: Normal rate and regular rhythm.     Pulses: Normal pulses.  Pulmonary:     Effort: Pulmonary effort is normal.     Breath sounds: Normal breath sounds.  Musculoskeletal:     Right lower leg: No edema.     Left lower leg: No edema.  Lymphadenopathy:     Cervical: Cervical adenopathy present.  Neurological:     Mental Status: He is alert and oriented to person, place, and time.     Lab Results  Component Value Date   WBC 4.6 11/21/2017   HGB 14.6 11/21/2017   HCT 43.2 11/21/2017   PLT 182.0 11/21/2017   GLUCOSE 105 (H) 11/21/2017   CHOL 192 11/21/2017   TRIG 51.0 11/21/2017   HDL 71.70 11/21/2017   LDLCALC 110 (H) 11/21/2017   ALT 18 11/21/2017   AST 25 11/21/2017   NA 142 11/21/2017   K 4.9 11/21/2017   CL 105 11/21/2017   CREATININE 1.17 11/21/2017   BUN 17 11/21/2017   CO2 32 11/21/2017    Dg Tibia/fibula Right  Result Date:  04/18/2018 CLINICAL DATA:  68 year old male with trauma to the right lower extremity. EXAM: RIGHT TIBIA AND FIBULA - 2 VIEW COMPARISON:  None. FINDINGS: There is no acute fracture or dislocation. There are degenerative changes of the right knee with mild depression of the lateral tibial plateau, likely chronic. Correlation with point tenderness recommended. The soft tissues appear unremarkable. IMPRESSION: 1. No acute fracture or dislocation. 2. 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 Electronically Signed   By: Anner Crete M.D.   On: 04/18/2018 22:19    Assessment & Plan:   Zachary Lawrence was seen today for cough.  Diagnoses and all orders for this visit:  Acute nasopharyngitis -     POCT Influenza A/B -     Chlorphen-PE-Acetaminophen 4-10-325 MG TABS; Take 1 tablet by mouth every 12 (twelve) hours for 3 days. -     ipratropium (ATROVENT) 0.03 % nasal spray; Place 2 sprays into both nostrils 2 (two) times daily. Do not use for more than 5days. -     dextromethorphan-guaiFENesin (MUCINEX DM) 30-600 MG 12hr tablet; Take 1 tablet by mouth 2 (two) times daily as needed for cough. -     benzonatate (TESSALON) 100 MG capsule; Take 1-2 capsules (100-200 mg total) by mouth 3 (three) times daily as needed for cough.  Encounter for lipid screening for cardiovascular disease -     Lipid panel; Future  Prostate cancer screening -     PSA, Medicare; Future  Encounter for hepatitis C screening test for low risk patient -     Hepatitis C Antibody; Future  Hyperglycemia -     Comprehensive metabolic panel; Future -     CBC; Future -     Hemoglobin A1c; Future   I have discontinued Zachary P. Osmun Jr.'s naproxen, oseltamivir, and scopolamine. I am also having him start on Chlorphen-PE-Acetaminophen, ipratropium, dextromethorphan-guaiFENesin, and benzonatate. Additionally, I am having him maintain his aspirin EC, Omega-3 Fatty Acids (OMEGA 3 PO), tamsulosin, B Complex  Vitamins (VITAMIN-B COMPLEX PO), and OVER THE COUNTER MEDICATION.  Meds ordered this encounter  Medications  . Chlorphen-PE-Acetaminophen 4-10-325 MG TABS    Sig: Take 1 tablet by mouth every 12 (twelve) hours for 3 days.    Dispense:  6 tablet    Refill:  0    Order Specific Question:   Supervising Provider    Answer:   Lucille Passy [[id number]]  . ipratropium (ATROVENT) 0.03 % nasal spray    Sig: Place 2 sprays into both nostrils 2 (two) times daily. Do not use for more than 5days.  Dispense:  30 mL    Refill:  0    Order Specific Question:   Supervising Provider    Answer:   Lucille Passy [[id number]]  . dextromethorphan-guaiFENesin (MUCINEX DM) 30-600 MG 12hr tablet    Sig: Take 1 tablet by mouth 2 (two) times daily as needed for cough.    Dispense:  14 tablet    Refill:  0    Order Specific Question:   Supervising Provider    Answer:   Lucille Passy [[id number]]  . benzonatate (TESSALON) 100 MG capsule    Sig: Take 1-2 capsules (100-200 mg total) by mouth 3 (three) times daily as needed for cough.    Dispense:  20 capsule    Refill:  0    Order Specific Question:   Supervising Provider    Answer:   Lucille Passy [[id number]]    Problem List Items Addressed This Visit    None    Visit Diagnoses    Acute nasopharyngitis    -  Primary   Relevant Medications   Chlorphen-PE-Acetaminophen 4-10-325 MG TABS   ipratropium (ATROVENT) 0.03 % nasal spray   dextromethorphan-guaiFENesin (MUCINEX DM) 30-600 MG 12hr tablet   benzonatate (TESSALON) 100 MG capsule   Other Relevant Orders   POCT Influenza A/B (Completed)   Encounter for lipid screening for cardiovascular disease       Relevant Orders   Lipid panel   Prostate cancer screening       Relevant Orders   PSA, Medicare   Encounter for hepatitis C screening test for low risk patient       Relevant Orders   Hepatitis C Antibody   Hyperglycemia       Relevant Orders   Comprehensive metabolic panel   CBC   Hemoglobin A1c        Follow-up: Return in about 6 weeks (around 02/27/2019) for AWV with wellness coach amd labs (fasting).  Wilfred Lacy, NP

## 2019-01-14 NOTE — Patient Instructions (Addendum)
Negative for influenza.  Symptoms are related to viral illness, so treatment entails symptom management.  Maintain adequate oral hydration.  Call office if no improvement in 1week.  Schedule  AWV with wellness coach on 02/27/2019. Need to be fasting on that day for blood draw (ok to drink water).   Upper Respiratory Infection, Adult An upper respiratory infection (URI) affects the nose, throat, and upper air passages. URIs are caused by germs (viruses). The most common type of URI is often called "the common cold." Medicines cannot cure URIs, but you can do things at home to relieve your symptoms. URIs usually get better within 7-10 days. Follow these instructions at home: Activity  Rest as needed.  If you have a fever, stay home from work or school until your fever is gone, or until your doctor says you may return to work or school. ? You should stay home until you cannot spread the infection anymore (you are not contagious). ? Your doctor may have you wear a face mask so you have less risk of spreading the infection. Relieving symptoms  Gargle with a salt-water mixture 3-4 times a day or as needed. To make a salt-water mixture, completely dissolve -1 tsp of salt in 1 cup of warm water.  Use a cool-mist humidifier to add moisture to the air. This can help you breathe more easily. Eating and drinking   Drink enough fluid to keep your pee (urine) pale yellow.  Eat soups and other clear broths. General instructions   Take over-the-counter and prescription medicines only as told by your doctor. These include cold medicines, fever reducers, and cough suppressants.  Do not use any products that contain nicotine or tobacco. These include cigarettes and e-cigarettes. If you need help quitting, ask your doctor.  Avoid being where people are smoking (avoid secondhand smoke).  Make sure you get regular shots and get the flu shot every year.  Keep all follow-up visits as told by  your doctor. This is important. How to avoid spreading infection to others   Wash your hands often with soap and water. If you do not have soap and water, use hand sanitizer.  Avoid touching your mouth, face, eyes, or nose.  Cough or sneeze into a tissue or your sleeve or elbow. Do not cough or sneeze into your hand or into the air. Contact a doctor if:  You are getting worse, not better.  You have any of these: ? A fever. ? Chills. ? Brown or red mucus in your nose. ? Yellow or brown fluid (discharge)coming from your nose. ? Pain in your face, especially when you bend forward. ? Swollen neck glands. ? Pain with swallowing. ? White areas in the back of your throat. Get help right away if:  You have shortness of breath that gets worse.  You have very bad or constant: ? Headache. ? Ear pain. ? Pain in your forehead, behind your eyes, and over your cheekbones (sinus pain). ? Chest pain.  You have long-lasting (chronic) lung disease along with any of these: ? Wheezing. ? Long-lasting cough. ? Coughing up blood. ? A change in your usual mucus.  You have a stiff neck.  You have changes in your: ? Vision. ? Hearing. ? Thinking. ? Mood. Summary  An upper respiratory infection (URI) is caused by a germ called a virus. The most common type of URI is often called "the common cold."  URIs usually get better within 7-10 days.  Take over-the-counter and prescription  medicines only as told by your doctor. This information is not intended to replace advice given to you by your health care provider. Make sure you discuss any questions you have with your health care provider. Document Released: 05/14/2008 Document Revised: 07/19/2017 Document Reviewed: 07/19/2017 Elsevier Interactive Patient Education  2019 [employer].

## 2019-01-19 ENCOUNTER — Ambulatory Visit: Payer: Medicare Other | Admitting: Nurse Practitioner

## 2019-01-19 ENCOUNTER — Ambulatory Visit: Payer: Self-pay | Admitting: *Deleted

## 2019-01-19 DIAGNOSIS — J00 Acute nasopharyngitis [common cold]: Secondary | ICD-10-CM

## 2019-01-19 MED ORDER — AZITHROMYCIN 250 MG PO TABS: 250.0000 mg | ORAL_TABLET | Freq: Every day | ORAL | 0 refills | Status: DC

## 2019-01-19 NOTE — Addendum Note (Signed)
Addended by: Wilfred Lacy L on: 01/19/2019 10:56 AM   Modules accepted: Orders

## 2019-01-19 NOTE — Telephone Encounter (Signed)
Contacted pt regarding symptoms; he says that he tested negative for the flu when seen in office on 01/14/2019; the pt says that he was up all night coughing, and is coughing up green phlegm (starting 01/16/2019); the pt states that he has taken a cough medication per the pharmacist at [facility] prior to being seen on 01/14/2019; recommendations made per nurse triage protocol; pt offered and accepted appointment with Wilfred Lacy; [facility], 01/19/2019 at 1000; he verbalized understanding; will route to office for notification of this upcoming appointment.    Reason for Disposition . [1] Continuous (nonstop) coughing interferes with work or school AND [2] no improvement using cough treatment per Care Advice  Answer Assessment - Initial Assessment Questions 1. ONSET: "When did the cough begin?"      Seen in office 01/14/2019 2. SEVERITY: "How bad is the cough today?"      Moderate to severe; worse at night 3. RESPIRATORY DISTRESS: "Describe your breathing."      Breathing ok; no distress 4. FEVER: "Do you have a fever?" If so, ask: "What is your temperature, how was it measured, and when did it start?"     no 5. SPUTUM: "Describe the color of your sputum" (clear, white, yellow, green)     green 6. HEMOPTYSIS: "Are you coughing up any blood?" If so ask: "How much?" (flecks, streaks, tablespoons, etc.)     no 7. CARDIAC HISTORY: "Do you have any history of heart disease?" (e.g., heart attack, congestive heart failure)      no 8. LUNG HISTORY: "Do you have any history of lung disease?"  (e.g., pulmonary embolus, asthma, emphysema)     no 9. PE RISK FACTORS: "Do you have a history of blood clots?" (or: recent major surgery, recent prolonged travel, bedridden)     Week long cruise 2 weeks ago (01/04/2019 - 01/11/2019); per pt Wilfred Lacy notified at his visit on 01/14/2019 10. OTHER SYMPTOMS: "Do you have any other symptoms?" (e.g., runny nose, wheezing, chest pain)       Sniffles (using nose spray as  previously prescribed) 11. PREGNANCY: "Is there any chance you are pregnant?" "When was your last menstrual period?"       n/a 12. TRAVEL: "Have you traveled out of the country in the last month?" (e.g., travel history, exposures)       Yes; pt cruised to [place], [place], and [place] (01/04/2019 - 01/14/2019)  Protocols used: [place]

## 2019-01-19 NOTE — Telephone Encounter (Signed)
Pt was wondering if we can just send in abx to [facility] today. Denied x-ray today.

## 2019-01-19 NOTE — Telephone Encounter (Signed)
Pt is aware.  

## 2019-03-04 ENCOUNTER — Other Ambulatory Visit: Payer: Self-pay

## 2019-03-04 ENCOUNTER — Ambulatory Visit (INDEPENDENT_AMBULATORY_CARE_PROVIDER_SITE_OTHER): Payer: Medicare Other | Admitting: Nurse Practitioner

## 2019-03-04 ENCOUNTER — Encounter: Payer: Self-pay | Admitting: Nurse Practitioner

## 2019-03-04 ENCOUNTER — Ambulatory Visit: Payer: Medicare Other | Admitting: *Deleted

## 2019-03-04 VITALS — BP 100/68 | HR 64 | Temp 98.1°F | Ht 72.0 in | Wt 187.4 lb

## 2019-03-04 DIAGNOSIS — R739 Hyperglycemia, unspecified: Secondary | ICD-10-CM

## 2019-03-04 DIAGNOSIS — Z1159 Encounter for screening for other viral diseases: Secondary | ICD-10-CM | POA: Diagnosis not present

## 2019-03-04 DIAGNOSIS — Z13220 Encounter for screening for lipoid disorders: Secondary | ICD-10-CM | POA: Diagnosis not present

## 2019-03-04 DIAGNOSIS — Z136 Encounter for screening for cardiovascular disorders: Secondary | ICD-10-CM

## 2019-03-04 DIAGNOSIS — M778 Other enthesopathies, not elsewhere classified: Secondary | ICD-10-CM | POA: Diagnosis not present

## 2019-03-04 DIAGNOSIS — Z125 Encounter for screening for malignant neoplasm of prostate: Secondary | ICD-10-CM

## 2019-03-04 DIAGNOSIS — R768 Other specified abnormal immunological findings in serum: Secondary | ICD-10-CM

## 2019-03-04 LAB — CBC
HCT: 42.7 % (ref 39.0–52.0)
Hemoglobin: 14.9 g/dL (ref 13.0–17.0)
MCHC: 34.9 g/dL (ref 30.0–36.0)
MCV: 92.7 fl (ref 78.0–100.0)
PLATELETS: 161 10*3/uL (ref 150.0–400.0)
RBC: 4.61 Mil/uL (ref 4.22–5.81)
RDW: 13.3 % (ref 11.5–15.5)
WBC: 5 10*3/uL (ref 4.0–10.5)

## 2019-03-04 LAB — COMPREHENSIVE METABOLIC PANEL
ALT: 11 U/L (ref 0–53)
AST: 18 U/L (ref 0–37)
Albumin: 4.4 g/dL (ref 3.5–5.2)
Alkaline Phosphatase: 46 U/L (ref 39–117)
BUN: 24 mg/dL — ABNORMAL HIGH (ref 6–23)
CALCIUM: 9.5 mg/dL (ref 8.4–10.5)
CO2: 30 mEq/L (ref 19–32)
CREATININE: 1.14 mg/dL (ref 0.40–1.50)
Chloride: 105 mEq/L (ref 96–112)
GFR: 63.85 mL/min (ref >60.00)
Glucose, Bld: 68 mg/dL — ABNORMAL LOW (ref 70–99)
Potassium: 4.4 mEq/L (ref 3.5–5.1)
SODIUM: 140 meq/L (ref 135–145)
TOTAL PROTEIN: 6.3 g/dL (ref 6.0–8.3)
Total Bilirubin: 1.1 mg/dL (ref 0.2–1.2)

## 2019-03-04 LAB — LIPID PANEL
Cholesterol: 191 mg/dL (ref 0–200)
HDL: 59.2 mg/dL (ref >39.00)
LDL CALC: 119 mg/dL — AB (ref 0–99)
NonHDL: 132.16
TRIGLYCERIDES: 66 mg/dL (ref 0.0–149.0)
Total CHOL/HDL Ratio: 3
VLDL: 13.2 mg/dL (ref 0.0–40.0)

## 2019-03-04 LAB — HEMOGLOBIN A1C: Hgb A1c MFr Bld: 5.5 % (ref 4.6–6.5)

## 2019-03-04 LAB — PSA, MEDICARE: PSA: 0.59 ng/mL (ref 0.10–4.00)

## 2019-03-04 MED ORDER — PREDNISONE 10 MG (21) PO TBPK: ORAL_TABLET | ORAL | 0 refills | Status: DC

## 2019-03-04 MED ORDER — DICLOFENAC SODIUM 2 % TD SOLN: 1.0000 [in_us] | Freq: Two times a day (BID) | TRANSDERMAL | 0 refills | Status: DC

## 2019-03-04 NOTE — Patient Instructions (Addendum)
Buy right thumb spicca brace over the counter.  Wear morning and evening x 1week, then at bedtime only Use pennsaid gel 1inch twice a day on right wrist.  Call office if no improvement in 2weeks.  Follow these instructions at home: If you have a splint or brace:  Wear it as told by your health care provider. Remove it only as told by your health care provider.  Loosen the splint or brace if your fingers become numb and tingle, or if they turn cold and blue.  Keep the splint or brace clean and dry. Managing pain, stiffness, and swelling  If directed, apply ice to the injured area. ? Put ice in a plastic bag. ? Place a towel between your skin and the bag. ? Leave the ice on for 20 minutes, 2-3 times per day.  Move your fingers often to avoid stiffness and to lessen swelling.  Raise (elevate) the injured area above the level of your heart while you are sitting or lying down.

## 2019-03-04 NOTE — Progress Notes (Addendum)
Subjective:  Patient ID: Zachary Bard., male    DOB: 07/10/1951  Age: 67 y.o. MRN: [id number]  CC: Pain (pt is complaining of thumb pain,right one is worse/going on 2 months/ no otc. )   Wrist Pain   The pain is present in the right wrist. This is a new problem. The current episode started more than 1 month ago (37months ago). There has been no history of extremity trauma. The problem occurs constantly. The problem has been unchanged. The quality of the pain is described as aching and dull. Associated symptoms include an inability to bear weight, stiffness and tingling. Pertinent negatives include no fever, itching, joint locking, joint swelling, limited range of motion or numbness. The symptoms are aggravated by activity. He has tried NSAIDS and rest for the symptoms. The treatment provided no relief. Family history does not include gout or rheumatoid arthritis. His past medical history is significant for osteoarthritis. There is no history of diabetes or gout.  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  Outpatient Medications Prior to Visit  Medication Sig Dispense Refill  . aspirin EC 325 MG tablet Take 325 mg by mouth as needed for mild pain.    . B Complex Vitamins (VITAMIN-B COMPLEX PO) Take by mouth.    . Omega-3 Fatty Acids (OMEGA 3 PO) Take 1 tablet by mouth daily.    Marland Kitchen OVER THE COUNTER MEDICATION Aerie's- otc for eyes.    . tamsulosin (FLOMAX) 0.4 MG CAPS capsule TAKE 1 CAPSULE BY MOUTH EVERY DAY 30 MINUTES BEFORE THE SAME MEAL  3  . azithromycin (ZITHROMAX Z-PAK) 250 MG tablet Take 1 tablet (250 mg total) by mouth daily. Take 2tabs on first day, then 1tab once a day till complete (Patient not taking: Reported on 03/04/2019) 6 tablet 0  . benzonatate (TESSALON) 100 MG capsule Take 1-2 capsules (100-200 mg total) by mouth 3 (three) times daily as needed for cough. (Patient not taking: Reported on 03/04/2019) 20  capsule 0  . dextromethorphan-guaiFENesin (MUCINEX DM) 30-600 MG 12hr tablet Take 1 tablet by mouth 2 (two) times daily as needed for cough. (Patient not taking: Reported on 03/04/2019) 14 tablet 0  . ipratropium (ATROVENT) 0.03 % nasal spray Place 2 sprays into both nostrils 2 (two) times daily. Do not use for more than 5days. (Patient not taking: Reported on 03/04/2019) 30 mL 0   No facility-administered medications prior to visit.     ROS See HPI  Objective:  BP 100/68   Pulse 64   Temp 98.1 F (36.7 C) (Oral)   Ht 6' (1.829 m)   Wt 187 lb 6.4 oz (85 kg)   SpO2 96%   BMI 25.42 kg/m   BP Readings from Last 3 Encounters:  03/04/19 100/68  01/14/19 102/62  04/18/18 119/68    Wt Readings from Last 3 Encounters:  03/04/19 187 lb 6.4 oz (85 kg)  01/14/19 186 lb 12.8 oz (84.7 kg)  04/18/18 185 lb (83.9 kg)    Physical Exam Vitals signs reviewed.  Cardiovascular:     Rate and Rhythm: Normal rate.  Pulmonary:     Effort: Pulmonary effort is normal.  Musculoskeletal:        General: Tenderness present. No swelling, deformity or signs of injury.     Right elbow: Normal.    Right wrist: He exhibits tenderness. He exhibits normal range of motion, no bony tenderness, no swelling and no effusion.  Right forearm: Normal.       Arms:     Right hand: He exhibits tenderness. Normal sensation noted. Normal strength noted.       Hands:     Comments: No muscle atrophy noted.  Neurological:     Mental Status: He is alert and oriented to person, place, and time.    Lab Results  Component Value Date   WBC 5.0 03/04/2019   HGB 14.9 03/04/2019   HCT 42.7 03/04/2019   PLT 161.0 03/04/2019   GLUCOSE 68 (L) 03/04/2019   CHOL 191 03/04/2019   TRIG 66.0 03/04/2019   HDL 59.20 03/04/2019   LDLCALC 119 (H) 03/04/2019   ALT 11 03/04/2019   AST 18 03/04/2019   NA 140 03/04/2019   K 4.4 03/04/2019   CL 105 03/04/2019   CREATININE 1.14 03/04/2019   BUN 24 (H) 03/04/2019   CO2 30  03/04/2019   PSA 0.59 03/04/2019   HGBA1C 5.5 03/04/2019    Dg Tibia/fibula Right  Result Date: 04/18/2018 CLINICAL DATA:  68 year old male with trauma to the right lower extremity. EXAM: RIGHT TIBIA AND FIBULA - 2 VIEW COMPARISON:  None. FINDINGS: There is no acute fracture or dislocation. There are degenerative changes of the right knee with mild depression of the lateral tibial plateau, likely chronic. Correlation with point tenderness recommended. The soft tissues appear unremarkable. IMPRESSION: 1. No acute fracture or dislocation. 2. 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 Electronically Signed   By: Anner Crete M.D.   On: 04/18/2018 22:19    Assessment & Plan:   Zachary Lawrence was seen today for pain.  Diagnoses and all orders for this visit:  Tendonitis of wrist, right -     predniSONE (STERAPRED UNI-PAK 21 TAB) 10 MG (21) TBPK tablet; As directed on package -     Diclofenac Sodium 2 % SOLN; Place 1 inch onto the skin 2 (two) times daily.  Hyperglycemia -     Hemoglobin A1c -     CBC -     Comprehensive metabolic panel  Encounter for hepatitis C screening test for low risk patient -     Hepatitis C Antibody  Prostate cancer screening -     PSA, Medicare  Encounter for lipid screening for cardiovascular disease -     Lipid panel  Positive hepatitis C antibody test -     AMB referral to hepatitis C clinic   I have discontinued Zachary P. Kaucher Jr.'s ipratropium, dextromethorphan-guaiFENesin, benzonatate, and azithromycin. I am also having him start on predniSONE and Diclofenac Sodium. Additionally, I am having him maintain his aspirin EC, Omega-3 Fatty Acids (OMEGA 3 PO), tamsulosin, B Complex Vitamins (VITAMIN-B COMPLEX PO), and OVER THE COUNTER MEDICATION.  Meds ordered this encounter  Medications  . predniSONE (STERAPRED UNI-PAK 21 TAB) 10 MG (21) TBPK tablet    Sig: As directed on package    Dispense:  21 tablet    Refill:  0     Order Specific Question:   Supervising Provider    Answer:   Lucille Passy [[id number]]  . Diclofenac Sodium 2 % SOLN    Sig: Place 1 inch onto the skin 2 (two) times daily.    Dispense:  2 g    Refill:  0    Order Specific Question:   Supervising Provider    Answer:   Lucille Passy [[id number]]    Problem List Items Addressed This Visit    None  Visit Diagnoses    Tendonitis of wrist, right    -  Primary   Relevant Medications   predniSONE (STERAPRED UNI-PAK 21 TAB) 10 MG (21) TBPK tablet   Diclofenac Sodium 2 % SOLN   Hyperglycemia       Encounter for hepatitis C screening test for low risk patient       Prostate cancer screening       Encounter for lipid screening for cardiovascular disease       Positive hepatitis C antibody test       Relevant Orders   AMB referral to hepatitis C clinic       Follow-up: Return if symptoms worsen or fail to improve.  Wilfred Lacy, NP

## 2019-03-06 NOTE — Addendum Note (Signed)
Addended by: Leana Gamer on: 03/06/2019 04:55 PM   Modules accepted: Orders

## 2019-03-09 LAB — HEPATITIS C ANTIBODY
HEP C AB: REACTIVE — AB
SIGNAL TO CUT-OFF: 22.9 — AB (ref <1.00)

## 2019-03-09 LAB — HCV RNA,QUANTITATIVE REAL TIME PCR
HCV QUANT LOG: NOT DETECTED {Log_IU}/mL
HCV RNA, PCR, QN: <15 IU/mL

## 2019-03-19 ENCOUNTER — Ambulatory Visit (INDEPENDENT_AMBULATORY_CARE_PROVIDER_SITE_OTHER): Payer: Medicare Other | Admitting: Family Medicine

## 2019-03-19 ENCOUNTER — Encounter: Payer: Self-pay | Admitting: Family Medicine

## 2019-03-19 ENCOUNTER — Other Ambulatory Visit: Payer: Self-pay

## 2019-03-19 ENCOUNTER — Ambulatory Visit: Payer: Self-pay | Admitting: *Deleted

## 2019-03-19 DIAGNOSIS — L255 Unspecified contact dermatitis due to plants, except food: Secondary | ICD-10-CM

## 2019-03-19 MED ORDER — PREDNISONE 10 MG (48) PO TBPK: ORAL_TABLET | ORAL | 0 refills | Status: DC

## 2019-03-19 NOTE — Progress Notes (Signed)
Virtual Visit via Video Note  I connected with Zachary Lawrence. on 03/19/19 at 10:30 AM EDT by a video enabled telemedicine application and verified that I am speaking with the correct person using two identifiers.   I discussed the limitations of evaluation and management by telemedicine and the availability of in person appointments. The patient expressed understanding and agreed to proceed.  History of Present Illness:    Observations/Objective:   Assessment and Plan:   Follow Up Instructions:    I discussed the assessment and treatment plan with the patient. The patient was provided an opportunity to ask questions and all were answered. The patient agreed with the plan and demonstrated an understanding of the instructions.   The patient was advised to call back or seek an in-person evaluation if the symptoms worsen or if the condition fails to improve as anticipated.  I provided 15 minutes of non-face-to-face time during this encounter.   Established Patient Office Visit  Subjective:  Patient ID: Zachary Lawrence., male    DOB: December 29, 1950  Age: 68 y.o. MRN: [id number]  CC:  Chief Complaint  Patient presents with  . Poison Ivy    right knee above and below, left forearm, left leg at ankle and calf/ 2 days/ calamine lotion not working    HPI [employer]. presents for treatment and evaluation of a 3 to 4-day history of a pruritic rash on his legs left wrist and scrotal sac.  This rash appeared after he had been working in his yard.  Rashes not painful.  There is no streaking fever chills nausea or vomiting.  He has been treating it with calamine lotion.  Rash is intensely pruritic.  Calamine lotion is not helping patient has no history of diabetes hypertension.  He recently took a 6-day Dosepak of prednisone for his wrist without issue.  Past Medical History:  Diagnosis Date  . GERD (gastroesophageal reflux disease)   . Hepatitis   . Overactive bladder     Past  Surgical History:  Procedure Laterality Date  . CHOLECYSTECTOMY  1992  . TONSILLECTOMY      Family History  Problem Relation Age of Onset  . CAD Father 13       CABG/Stent  . Ovarian cancer Sister   . Alzheimer's disease Mother   . Heart disease Paternal Uncle   . Cancer Paternal Uncle        prostate cancer    Social History   Socioeconomic History  . Marital status: Legally Separated    Spouse name: Not on file  . Number of children: 2  . Years of education: Not on file  . Highest education level: Not on file  Occupational History  . Occupation: Cabin crew  Social Needs  . Financial resource strain: Not on file  . Food insecurity:    Worry: Not on file    Inability: Not on file  . Transportation needs:    Medical: Not on file    Non-medical: Not on file  Tobacco Use  . Smoking status: Former Smoker    Types: Cigarettes    Last attempt to quit: 12/11/1975    Years since quitting: 43.2  . Smokeless tobacco: Never Used  Substance and Sexual Activity  . Alcohol use: Yes    Comment: social  . Drug use: No  . Sexual activity: Yes  Lifestyle  . Physical activity:    Days per week: Not on file    Minutes per  session: Not on file  . Stress: Not on file  Relationships  . Social connections:    Talks on phone: Not on file    Gets together: Not on file    Attends religious service: Not on file    Active member of club or organization: Not on file    Attends meetings of clubs or organizations: Not on file    Relationship status: Not on file  . Intimate partner violence:    Fear of current or ex partner: Not on file    Emotionally abused: Not on file    Physically abused: Not on file    Forced sexual activity: Not on file  Other Topics Concern  . Not on file  Social History Narrative   Lives alone.      Outpatient Medications Prior to Visit  Medication Sig Dispense Refill  . aspirin EC 325 MG tablet Take 325 mg by mouth as needed for mild pain.    . B Complex  Vitamins (VITAMIN-B COMPLEX PO) Take by mouth.    Marland Kitchen OVER THE COUNTER MEDICATION Aerie's- otc for eyes.    . tamsulosin (FLOMAX) 0.4 MG CAPS capsule TAKE 1 CAPSULE BY MOUTH EVERY DAY 30 MINUTES BEFORE THE SAME MEAL  3  . Diclofenac Sodium 2 % SOLN Place 1 inch onto the skin 2 (two) times daily. (Patient not taking: Reported on 03/19/2019) 2 g 0  . Omega-3 Fatty Acids (OMEGA 3 PO) Take 1 tablet by mouth daily.    . predniSONE (STERAPRED UNI-PAK 21 TAB) 10 MG (21) TBPK tablet As directed on package (Patient not taking: Reported on 03/19/2019) 21 tablet 0   No facility-administered medications prior to visit.     No Known Allergies  ROS Review of Systems  Constitutional: Negative for chills, diaphoresis, fatigue, fever and unexpected weight change.  Respiratory: Negative.   Cardiovascular: Negative.   Gastrointestinal: Negative.   Endocrine: Negative for polyphagia and polyuria.  Skin: Positive for color change, pallor and rash.  Neurological: Negative for headaches.  Hematological: Does not bruise/bleed easily.  Psychiatric/Behavioral: Negative.       Objective:    Physical Exam  Constitutional: He is oriented to person, place, and time. He appears well-developed and well-nourished. No distress.  HENT:  Head: Normocephalic and atraumatic.  Right Ear: External ear normal.  Left Ear: External ear normal.  Eyes: Right eye exhibits no discharge. Left eye exhibits no discharge. No scleral icterus.  Pulmonary/Chest: Effort normal.  Neurological: He is alert and oriented to person, place, and time.  Skin: Skin is warm and dry. He is not diaphoretic.     Psychiatric: He has a normal mood and affect. His behavior is normal.    There were no vitals taken for this visit. Wt Readings from Last 3 Encounters:  03/04/19 187 lb 6.4 oz (85 kg)  01/14/19 186 lb 12.8 oz (84.7 kg)  04/18/18 185 lb (83.9 kg)     Health Maintenance Due  Topic Date Due  . Hepatitis C Screening  1951/09/28  .  COLONOSCOPY  01/19/2013  . PNA vac Low Risk Adult (1 of 2 - PCV13) 01/02/2016    There are no preventive care reminders to display for this patient.  No results found for: TSH Lab Results  Component Value Date   WBC 5.0 03/04/2019   HGB 14.9 03/04/2019   HCT 42.7 03/04/2019   MCV 92.7 03/04/2019   PLT 161.0 03/04/2019   Lab Results  Component Value Date  NA 140 03/04/2019   K 4.4 03/04/2019   CO2 30 03/04/2019   GLUCOSE 68 (L) 03/04/2019   BUN 24 (H) 03/04/2019   CREATININE 1.14 03/04/2019   BILITOT 1.1 03/04/2019   ALKPHOS 46 03/04/2019   AST 18 03/04/2019   ALT 11 03/04/2019   PROT 6.3 03/04/2019   ALBUMIN 4.4 03/04/2019   CALCIUM 9.5 03/04/2019   GFR 63.85 03/04/2019   Lab Results  Component Value Date   CHOL 191 03/04/2019   Lab Results  Component Value Date   HDL 59.20 03/04/2019   Lab Results  Component Value Date   LDLCALC 119 (H) 03/04/2019   Lab Results  Component Value Date   TRIG 66.0 03/04/2019   Lab Results  Component Value Date   CHOLHDL 3 03/04/2019   Lab Results  Component Value Date   HGBA1C 5.5 03/04/2019      Assessment & Plan:   Problem List Items Addressed This Visit      Musculoskeletal and Integument   Rhus dermatitis - Primary   Relevant Medications   predniSONE (STERAPRED UNI-PAK 48 TAB) 10 MG (48) TBPK tablet      Meds ordered this encounter  Medications  . predniSONE (STERAPRED UNI-PAK 48 TAB) 10 MG (48) TBPK tablet    Sig: Pharm to instruct a 12 day taper.    Dispense:  48 tablet    Refill:  0    Follow-up: Return in about 1 week (around 03/26/2019), or if symptoms worsen or fail to improve.    Libby Maw, MD   We will go ahead and use a 12-day Dosepak.  Discussed that side effects could be more pronounced with a 12-day taper than the 6-day taper that he just smoked.  Asked him to look out for increased appetite and irritability.  He will follow-up in 1 week if he is not improving.   Libby Maw, MD

## 2019-03-19 NOTE — Telephone Encounter (Signed)
Pt reports outbreak of poison ivy, onset Tuesday. States area is at right knee, above and below "Knee cap, going to calf a little." States area above knee has open weeping area. Also reports knee appears swollen. Total area above knee "Size of fist" Few inches area below knee.Moderate-severe itching. Has applied calamine. Initially calling to see if there was any med that could be called in for him. [place] called practice and spoke to [facility] to advise on virtual appt. , call transferred. Email and phone # verified. Reason for Disposition . SEVERE itching (e.g., interferes with sleep or normal activities)  Answer Assessment - Initial Assessment Questions 1. APPEARANCE of RASH: "Describe the rash."      Few places opened, weeping; other place welped up 2. LOCATION: "Where is the rash located?"      Inside left above and below right knee cap, going into calf 3. SIZE: "How large is the rash?"      Size of fist above knee, below knee few inches 4. ONSET: "When did the rash begin?"      Tuesday 5. ITCHING: "Does the rash itch?" If so, ask: "How bad is it?"   - MILD - doesn't interfere with normal activities   - MODERATE - SEVERE: interferes with work, school, sleep, or other activities      Moderate-severe  Protocols used: [place]

## 2019-03-25 ENCOUNTER — Telehealth: Payer: Self-pay | Admitting: Nurse Practitioner

## 2019-03-25 NOTE — Telephone Encounter (Signed)
I called and left message on patient voicemail to call office and schedule a telephone visit with Wilfred Lacy per her request for Hep C test.

## 2019-05-05 DIAGNOSIS — H353131 Nonexudative age-related macular degeneration, bilateral, early dry stage: Secondary | ICD-10-CM | POA: Diagnosis not present

## 2019-05-05 DIAGNOSIS — H2513 Age-related nuclear cataract, bilateral: Secondary | ICD-10-CM | POA: Diagnosis not present

## 2019-05-05 DIAGNOSIS — H524 Presbyopia: Secondary | ICD-10-CM | POA: Diagnosis not present

## 2019-05-14 DIAGNOSIS — R358 Other polyuria: Secondary | ICD-10-CM | POA: Diagnosis not present

## 2019-05-14 DIAGNOSIS — N401 Enlarged prostate with lower urinary tract symptoms: Secondary | ICD-10-CM | POA: Diagnosis not present

## 2019-05-14 DIAGNOSIS — N138 Other obstructive and reflux uropathy: Secondary | ICD-10-CM | POA: Diagnosis not present

## 2019-05-14 DIAGNOSIS — R339 Retention of urine, unspecified: Secondary | ICD-10-CM | POA: Diagnosis not present

## 2019-09-15 NOTE — Progress Notes (Deleted)
Subjective:   Zachary Lawrence. is a 68 y.o. male who presents for Medicare Annual/Subsequent preventive examination.  Review of Systems:  Home Safety/Smoke Alarms: Feels safe in home. Smoke alarms in place.  Lives alone in 2 story home.  Male:   CCS-     PSA-  Lab Results  Component Value Date   PSA 0.59 03/04/2019       Objective:    Vitals: There were no vitals taken for this visit.  There is no height or weight on file to calculate BMI.  Advanced Directives 02/26/2018 01/26/2015  Does Patient Have a Medical Advance Directive? No No  Would patient like information on creating a medical advance directive? Yes (MAU/Ambulatory/Procedural Areas - Information given) No - patient declined information    Tobacco Social History   Tobacco Use  Smoking Status Former Smoker  . Types: Cigarettes  . Quit date: 12/11/1975  . Years since quitting: 43.7  Smokeless Tobacco Never Used     Counseling given: Not Answered   Clinical Intake:                       Past Medical History:  Diagnosis Date  . GERD (gastroesophageal reflux disease)   . Hepatitis   . Overactive bladder    Past Surgical History:  Procedure Laterality Date  . CHOLECYSTECTOMY  1992  . TONSILLECTOMY     Family History  Problem Relation Age of Onset  . CAD Father 59       CABG/Stent  . Ovarian cancer Sister   . Alzheimer's disease Mother   . Heart disease Paternal Uncle   . Cancer Paternal Uncle        prostate cancer   Social History   Socioeconomic History  . Marital status: Legally Separated    Spouse name: Not on file  . Number of children: 2  . Years of education: Not on file  . Highest education level: Not on file  Occupational History  . Occupation: Cabin crew  Social Needs  . Financial resource strain: Not on file  . Food insecurity    Worry: Not on file    Inability: Not on file  . Transportation needs    Medical: Not on file    Non-medical: Not on file  Tobacco Use   . Smoking status: Former Smoker    Types: Cigarettes    Quit date: 12/11/1975    Years since quitting: 43.7  . Smokeless tobacco: Never Used  Substance and Sexual Activity  . Alcohol use: Yes    Comment: social  . Drug use: No  . Sexual activity: Yes  Lifestyle  . Physical activity    Days per week: Not on file    Minutes per session: Not on file  . Stress: Not on file  Relationships  . Social Herbalist on phone: Not on file    Gets together: Not on file    Attends religious service: Not on file    Active member of club or organization: Not on file    Attends meetings of clubs or organizations: Not on file    Relationship status: Not on file  Other Topics Concern  . Not on file  Social History Narrative   Lives alone.      Outpatient Encounter Medications as of 09/16/2019  Medication Sig  . aspirin EC 325 MG tablet Take 325 mg by mouth as needed for mild pain.  Marland Kitchen B  Complex Vitamins (VITAMIN-B COMPLEX PO) Take by mouth.  . Diclofenac Sodium 2 % SOLN Place 1 inch onto the skin 2 (two) times daily. (Patient not taking: Reported on 03/19/2019)  . Omega-3 Fatty Acids (OMEGA 3 PO) Take 1 tablet by mouth daily.  Marland Kitchen OVER THE COUNTER MEDICATION Aerie's- otc for eyes.  . predniSONE (STERAPRED UNI-PAK 48 TAB) 10 MG (48) TBPK tablet Pharm to instruct a 12 day taper.  . tamsulosin (FLOMAX) 0.4 MG CAPS capsule TAKE 1 CAPSULE BY MOUTH EVERY DAY 30 MINUTES BEFORE THE SAME MEAL   No facility-administered encounter medications on file as of 09/16/2019.     Activities of Daily Living No flowsheet data found.  Patient Care Team: Nche, Charlene Brooke, NP as PCP - General (Internal Medicine) Ronald Lobo, MD as Consulting Physician (Gastroenterology)   Assessment:   This is a routine wellness examination for [facility]. Physical assessment deferred to PCP.  Exercise Activities and Dietary recommendations   Diet (meal preparation, eat out, water intake, caffeinated beverages, dairy  products, fruits and vegetables): {Desc; diets:16563} Breakfast: Lunch:  Dinner:      Goals    . Complete colonoscopy    . Maintain healthy active lifestyle. (pt-stated)    . Visit eye doctor       Fall Risk Fall Risk  01/14/2019 02/26/2018 11/15/2017  Falls in the past year? 0 No No     Depression Screen PHQ 2/9 Scores 01/14/2019 02/26/2018 11/15/2017  PHQ - 2 Score 0 0 0    Cognitive Function Ad8 score reviewed for issues:  Issues making decisions:  Less interest in hobbies / activities:  Repeats questions, stories (family complaining):  Trouble using ordinary gadgets (microwave, computer, phone):  Forgets the month or year:   Mismanaging finances:   Remembering appts:  Daily problems with thinking and/or memory: Ad8 score is=         Immunization History  Administered Date(s) Administered  . Tdap 11/28/2009, 04/18/2018   Screening Tests Health Maintenance  Topic Date Due  . COLONOSCOPY  01/19/2013  . PNA vac Low Risk Adult (1 of 2 - PCV13) 01/02/2016  . INFLUENZA VACCINE  07/11/2019  . TETANUS/TDAP  04/18/2028  . Hepatitis C Screening  Completed       Plan:   ***  I have personally reviewed and noted the following in the patient's chart:   . Medical and social history . Use of alcohol, tobacco or illicit drugs  . Current medications and supplements . Functional ability and status . Nutritional status . Physical activity . Advanced directives . List of other physicians . Hospitalizations, surgeries, and ER visits in previous 12 months . Vitals . Screenings to include cognitive, depression, and falls . Referrals and appointments  In addition, I have reviewed and discussed with patient certain preventive protocols, quality metrics, and best practice recommendations. A written personalized care plan for preventive services as well as general preventive health recommendations were provided to patient.     Shela Nevin, [place]  09/15/2019

## 2019-09-16 ENCOUNTER — Ambulatory Visit: Payer: Medicare Other | Admitting: *Deleted

## 2019-11-04 DIAGNOSIS — Z08 Encounter for follow-up examination after completed treatment for malignant neoplasm: Secondary | ICD-10-CM | POA: Diagnosis not present

## 2019-11-04 DIAGNOSIS — L820 Inflamed seborrheic keratosis: Secondary | ICD-10-CM | POA: Diagnosis not present

## 2019-11-04 DIAGNOSIS — L570 Actinic keratosis: Secondary | ICD-10-CM | POA: Diagnosis not present

## 2019-11-04 DIAGNOSIS — D485 Neoplasm of uncertain behavior of skin: Secondary | ICD-10-CM | POA: Diagnosis not present

## 2019-11-04 DIAGNOSIS — Z85828 Personal history of other malignant neoplasm of skin: Secondary | ICD-10-CM | POA: Diagnosis not present

## 2020-01-12 DIAGNOSIS — N401 Enlarged prostate with lower urinary tract symptoms: Secondary | ICD-10-CM | POA: Diagnosis not present

## 2020-01-12 DIAGNOSIS — N138 Other obstructive and reflux uropathy: Secondary | ICD-10-CM | POA: Diagnosis not present

## 2020-02-18 ENCOUNTER — Telehealth: Payer: Self-pay | Admitting: Nurse Practitioner

## 2020-02-18 NOTE — Telephone Encounter (Signed)
Left message for patient to schedule Annual Wellness Visit.  Please schedule with Nurse Health Advisor Victoria Britt, RN at [place] [facility]  

## 2020-02-19 NOTE — Telephone Encounter (Signed)
error 

## 2020-04-25 ENCOUNTER — Other Ambulatory Visit: Payer: Self-pay

## 2020-04-26 NOTE — Progress Notes (Signed)
Subjective:   Zachary Aston. is a 69 y.o. male who presents for Medicare Annual/Subsequent preventive examination.  Pt c/o bilateral thumb pain. R>L. Also c/o dizzy spells w/ exertion. Will discuss w/PCP -appt tomorrow 04/28/20.   Still working more than full time in [employer] [place].   Review of Systems:  Home Safety/Smoke Alarms: Feels safe in home. Smoke alarms in place.  Lives alone in 2 story home. No issue w/ stairs.   Male:   CCS-  Pt reports follows w/Dr.Buccini. Can't recall date. PSA-  Lab Results  Component Value Date   PSA 0.59 03/04/2019       Objective:    Vitals: BP 132/78 (BP Location: Left Arm, Patient Position: Sitting, Cuff Size: Normal)   Pulse 67   Temp (!) 97 F (36.1 C) (Temporal)   Ht 6' (1.829 m)   Wt 177 lb 6.4 oz (80.5 kg)   SpO2 98%   BMI 24.06 kg/m   Body mass index is 24.06 kg/m.  Advanced Directives 04/27/2020 02/26/2018 01/26/2015  Does Patient Have a Medical Advance Directive? No No No  Would patient like information on creating a medical advance directive? No - Patient declined Yes (MAU/Ambulatory/Procedural Areas - Information given) No - patient declined information    Tobacco Social History   Tobacco Use  Smoking Status Former Smoker  . Types: Cigarettes  . Quit date: 12/11/1975  . Years since quitting: 44.4  Smokeless Tobacco Never Used     Counseling given: Not Answered   Clinical Intake:   Pain : No/denies pain      Past Medical History:  Diagnosis Date  . GERD (gastroesophageal reflux disease)   . Hepatitis   . Overactive bladder    Past Surgical History:  Procedure Laterality Date  . CHOLECYSTECTOMY  1992  . TONSILLECTOMY     Family History  Problem Relation Age of Onset  . CAD Father 98       CABG/Stent  . Ovarian cancer Sister   . Alzheimer's disease Mother   . Heart disease Paternal Uncle   . Cancer Paternal Uncle        prostate cancer   Social History   Socioeconomic History  . Marital status:  Legally Separated    Spouse name: Not on file  . Number of children: 2  . Years of education: Not on file  . Highest education level: Not on file  Occupational History  . Occupation: realtor  Tobacco Use  . Smoking status: Former Smoker    Types: Cigarettes    Quit date: 12/11/1975    Years since quitting: 44.4  . Smokeless tobacco: Never Used  Substance and Sexual Activity  . Alcohol use: Yes    Comment: social  . Drug use: No  . Sexual activity: Yes  Other Topics Concern  . Not on file  Social History Narrative   Lives alone.     Social Determinants of Health   Financial Resource Strain: Low Risk   . Difficulty of Paying Living Expenses: Not hard at all  Food Insecurity: No Food Insecurity  . Worried About Charity fundraiser in the Last Year: Never true  . Ran Out of Food in the Last Year: Never true  Transportation Needs: No Transportation Needs  . Lack of Transportation (Medical): No  . Lack of Transportation (Non-Medical): No  Physical Activity:   . Days of Exercise per Week:   . Minutes of Exercise per Session:   Stress:   .  Feeling of Stress :   Social Connections:   . Frequency of Communication with Friends and Family:   . Frequency of Social Gatherings with Friends and Family:   . Attends Religious Services:   . Active Member of Clubs or Organizations:   . Attends Archivist Meetings:   Marland Kitchen Marital Status:     Outpatient Encounter Medications as of 04/27/2020  Medication Sig  . Diclofenac Sodium 2 % SOLN Place 1 inch onto the skin 2 (two) times daily.  Marland Kitchen FOLIC ACID PO Take by mouth.  . Omega-3 Fatty Acids (OMEGA 3 PO) Take 1 tablet by mouth daily.  Marland Kitchen OVER THE COUNTER MEDICATION Aerie's- otc for eyes.  . tamsulosin (FLOMAX) 0.4 MG CAPS capsule TAKE 1 CAPSULE BY MOUTH EVERY DAY 30 MINUTES BEFORE THE SAME MEAL  . aspirin EC 325 MG tablet Take 325 mg by mouth as needed for mild pain.  . B Complex Vitamins (VITAMIN-B COMPLEX PO) Take by mouth.  .  [DISCONTINUED] predniSONE (STERAPRED UNI-PAK 48 TAB) 10 MG (48) TBPK tablet Pharm to instruct a 12 day taper.   No facility-administered encounter medications on file as of 04/27/2020.    Activities of Daily Living In your present state of health, do you have any difficulty performing the following activities: 04/27/2020  Hearing? N  Vision? N  Difficulty concentrating or making decisions? N  Walking or climbing stairs? N  Dressing or bathing? N  Doing errands, shopping? N  Preparing Food and eating ? N  Using the Toilet? N  In the past six months, have you accidently leaked urine? N  Do you have problems with loss of bowel control? N  Managing your Medications? N  Managing your Finances? N  Housekeeping or managing your Housekeeping? N  Some recent data might be hidden    Patient Care Team: Nche, Charlene Brooke, NP as PCP - General (Internal Medicine) Ronald Lobo, MD as Consulting Physician (Gastroenterology)   Assessment:   This is a routine wellness examination for [facility]. Physical assessment deferred to PCP.   Exercise Activities and Dietary recommendations Current Exercise Habits: Home exercise routine, Type of exercise: strength training/weights, Time (Minutes): 30, Frequency (Times/Week): 2, Weekly Exercise (Minutes/Week): 60, Exercise limited by: None identified   Diet (meal preparation, eat out, water intake, caffeinated beverages, dairy products, fruits and vegetables): in general, a "healthy" diet  , well balanced   Goals    . DIET - INCREASE WATER INTAKE    . Maintain healthy active lifestyle. (pt-stated)    . Visit eye doctor       Fall Risk Fall Risk  04/27/2020 01/14/2019 02/26/2018 11/15/2017  Falls in the past year? 0 0 No No  Number falls in past yr: 0 - - -  Injury with Fall? 0 - - -  Follow up Education provided;Falls prevention discussed - - -   Depression Screen PHQ 2/9 Scores 04/27/2020 01/14/2019 02/26/2018 11/15/2017  PHQ - 2 Score 0 0 0 0     Cognitive Function Ad8 score reviewed for issues:  Issues making decisions:no  Less interest in hobbies / activities:no  Repeats questions, stories (family complaining):no  Trouble using ordinary gadgets (microwave, computer, phone):no  Forgets the month or year: no  Mismanaging finances: no  Remembering appts:no  Daily problems with thinking and/or memory:no Ad8 score is=0         Immunization History  Administered Date(s) Administered  . Tdap 11/28/2009, 04/18/2018     Screening Tests Health Maintenance  Topic  Date Due  . COVID-19 Vaccine (1) Never done  . COLONOSCOPY  01/19/2013  . PNA vac Low Risk Adult (1 of 2 - PCV13) Never done  . INFLUENZA VACCINE  07/10/2020  . TETANUS/TDAP  04/18/2028  . Hepatitis C Screening  Completed       Plan:   See you next year!  Continue to eat heart healthy diet (full of fruits, vegetables, whole grains, lean protein, water--limit salt, fat, and sugar intake) and increase physical activity as tolerated.  Continue doing brain stimulating activities (puzzles, reading, adult coloring books, staying active) to keep memory sharp.     I have personally reviewed and noted the following in the patient's chart:   . Medical and social history . Use of alcohol, tobacco or illicit drugs  . Current medications and supplements . Functional ability and status . Nutritional status . Physical activity . Advanced directives . List of other physicians . Hospitalizations, surgeries, and ER visits in previous 12 months . Vitals . Screenings to include cognitive, depression, and falls . Referrals and appointments  In addition, I have reviewed and discussed with patient certain preventive protocols, quality metrics, and best practice recommendations. A written personalized care plan for preventive services as well as general preventive health recommendations were provided to patient.     Shela Nevin,  [place]  04/27/2020

## 2020-04-27 ENCOUNTER — Encounter: Payer: Self-pay | Admitting: *Deleted

## 2020-04-27 ENCOUNTER — Ambulatory Visit (INDEPENDENT_AMBULATORY_CARE_PROVIDER_SITE_OTHER): Payer: Medicare Other | Admitting: *Deleted

## 2020-04-27 VITALS — BP 132/78 | HR 67 | Temp 97.0°F | Ht 72.0 in | Wt 177.4 lb

## 2020-04-27 DIAGNOSIS — Z0000 Encounter for general adult medical examination without abnormal findings: Secondary | ICD-10-CM | POA: Diagnosis not present

## 2020-04-27 NOTE — Patient Instructions (Signed)
See you next year!  Continue to eat heart healthy diet (full of fruits, vegetables, whole grains, lean protein, water--limit salt, fat, and sugar intake) and increase physical activity as tolerated.  Continue doing brain stimulating activities (puzzles, reading, adult coloring books, staying active) to keep memory sharp.    Zachary Lawrence , Thank you for taking time to come for your Medicare Wellness Visit. I appreciate your ongoing commitment to your health goals. Please review the following plan we discussed and let me know if I can assist you in the future.   These are the goals we discussed: Goals    . DIET - INCREASE WATER INTAKE    . Maintain healthy active lifestyle. (pt-stated)    . Visit eye doctor       This is a list of the screening recommended for you and due dates:  Health Maintenance  Topic Date Due  . COVID-19 Vaccine (1) Never done  . Colon Cancer Screening  01/19/2013  . Pneumonia vaccines (1 of 2 - PCV13) Never done  . Flu Shot  07/10/2020  . Tetanus Vaccine  04/18/2028  .  Hepatitis C: One time screening is recommended by Center for Disease Control  (CDC) for  adults born from [birth year] through 1965.   Completed    Preventive Care 82 Years and Older, Male Preventive care refers to lifestyle choices and visits with your health care provider that can promote health and wellness. This includes:  A yearly physical exam. This is also called an annual well check.  Regular dental and eye exams.  Immunizations.  Screening for certain conditions.  Healthy lifestyle choices, such as diet and exercise. What can I expect for my preventive care visit? Physical exam Your health care provider will check:  Height and weight. These may be used to calculate body mass index (BMI), which is a measurement that tells if you are at a healthy weight.  Heart rate and blood pressure.  Your skin for abnormal spots. Counseling Your health care provider may ask you questions  about:  Alcohol, tobacco, and drug use.  Emotional well-being.  Home and relationship well-being.  Sexual activity.  Eating habits.  History of falls.  Memory and ability to understand (cognition).  Work and work Statistician. What immunizations do I need?  Influenza (flu) vaccine  This is recommended every year. Tetanus, diphtheria, and pertussis (Tdap) vaccine  You may need a Td booster every 10 years. Varicella (chickenpox) vaccine  You may need this vaccine if you have not already been vaccinated. Zoster (shingles) vaccine  You may need this after age 15. Pneumococcal conjugate (PCV13) vaccine  One dose is recommended after age 97. Pneumococcal polysaccharide (PPSV23) vaccine  One dose is recommended after age 75. Measles, mumps, and rubella (MMR) vaccine  You may need at least one dose of MMR if you were born in 1957 or later. You may also need a second dose. Meningococcal conjugate (MenACWY) vaccine  You may need this if you have certain conditions. Hepatitis A vaccine  You may need this if you have certain conditions or if you travel or work in places where you may be exposed to hepatitis A. Hepatitis B vaccine  You may need this if you have certain conditions or if you travel or work in places where you may be exposed to hepatitis B. Haemophilus influenzae type b (Hib) vaccine  You may need this if you have certain conditions. You may receive vaccines as individual doses or as more than  one vaccine together in one shot (combination vaccines). Talk with your health care provider about the risks and benefits of combination vaccines. What tests do I need? Blood tests  Lipid and cholesterol levels. These may be checked every 5 years, or more frequently depending on your overall health.  Hepatitis C test.  Hepatitis B test. Screening  Lung cancer screening. You may have this screening every year starting at age 75 if you have a 30-pack-year history of  smoking and currently smoke or have quit within the past 15 years.  Colorectal cancer screening. All adults should have this screening starting at age 89 and continuing until age 72. Your health care provider may recommend screening at age 31 if you are at increased risk. You will have tests every 1-10 years, depending on your results and the type of screening test.  Prostate cancer screening. Recommendations will vary depending on your family history and other risks.  Diabetes screening. This is done by checking your blood sugar (glucose) after you have not eaten for a while (fasting). You may have this done every 1-3 years.  Abdominal aortic aneurysm (AAA) screening. You may need this if you are a current or former smoker.  Sexually transmitted disease (STD) testing. Follow these instructions at home: Eating and drinking  Eat a diet that includes fresh fruits and vegetables, whole grains, lean protein, and low-fat dairy products. Limit your intake of foods with high amounts of sugar, saturated fats, and salt.  Take vitamin and mineral supplements as recommended by your health care provider.  Do not drink alcohol if your health care provider tells you not to drink.  If you drink alcohol: ? Limit how much you have to 0-2 drinks a day. ? Be aware of how much alcohol is in your drink. In the [place] one drink equals one 12 oz bottle of beer (355 mL), one 5 oz glass of wine (148 mL), or one 1 oz glass of hard liquor (44 mL). Lifestyle  Take daily care of your teeth and gums.  Stay active. Exercise for at least 30 minutes on 5 or more days each week.  Do not use any products that contain nicotine or tobacco, such as cigarettes, e-cigarettes, and chewing tobacco. If you need help quitting, ask your health care provider.  If you are sexually active, practice safe sex. Use a condom or other form of protection to prevent STIs (sexually transmitted infections).  Talk with your health care  provider about taking a low-dose aspirin or statin. What's next?  Visit your health care provider once a year for a well check visit.  Ask your health care provider how often you should have your eyes and teeth checked.  Stay up to date on all vaccines. This information is not intended to replace advice given to you by your health care provider. Make sure you discuss any questions you have with your health care provider. Document Revised: 11/20/2018 Document Reviewed: 11/20/2018 Elsevier Patient Education  2020 [employer].

## 2020-04-28 ENCOUNTER — Encounter: Payer: Self-pay | Admitting: Nurse Practitioner

## 2020-04-28 ENCOUNTER — Other Ambulatory Visit: Payer: Self-pay

## 2020-04-28 ENCOUNTER — Ambulatory Visit (INDEPENDENT_AMBULATORY_CARE_PROVIDER_SITE_OTHER): Payer: Medicare Other

## 2020-04-28 ENCOUNTER — Ambulatory Visit (INDEPENDENT_AMBULATORY_CARE_PROVIDER_SITE_OTHER): Payer: Medicare Other | Admitting: Nurse Practitioner

## 2020-04-28 VITALS — BP 120/68 | HR 55 | Temp 97.4°F | Ht 72.0 in | Wt 176.8 lb

## 2020-04-28 DIAGNOSIS — R079 Chest pain, unspecified: Secondary | ICD-10-CM | POA: Diagnosis not present

## 2020-04-28 DIAGNOSIS — R0789 Other chest pain: Secondary | ICD-10-CM

## 2020-04-28 DIAGNOSIS — Z13220 Encounter for screening for lipoid disorders: Secondary | ICD-10-CM

## 2020-04-28 DIAGNOSIS — Z125 Encounter for screening for malignant neoplasm of prostate: Secondary | ICD-10-CM | POA: Diagnosis not present

## 2020-04-28 DIAGNOSIS — Z136 Encounter for screening for cardiovascular disorders: Secondary | ICD-10-CM | POA: Diagnosis not present

## 2020-04-28 DIAGNOSIS — H6122 Impacted cerumen, left ear: Secondary | ICD-10-CM | POA: Diagnosis not present

## 2020-04-28 MED ORDER — NITROGLYCERIN 0.4 MG SL SUBL: 0.4000 mg | SUBLINGUAL_TABLET | SUBLINGUAL | 0 refills | Status: DC | PRN

## 2020-04-28 NOTE — Progress Notes (Signed)
Subjective:  Patient ID: Zachary Bard., male    DOB: 1951-08-19  Age: 69 y.o. MRN: [id number]  CC: Follow-up (follow up from wellness visit yesterday-pt mentioned getting blood work//no covid//no pnumonia//thinks he had colonoscopy in last 3 or 4 years)  Chest Pain  This is a new problem. The current episode started more than 1 month ago. The onset quality is gradual. The problem occurs intermittently. The problem has been waxing and waning. The pain is present in the substernal region. The pain is moderate. The quality of the pain is described as pressure, squeezing and tightness. The pain does not radiate. Associated symptoms include diaphoresis and exertional chest pressure. Pertinent negatives include no abdominal pain, back pain, cough, dizziness, fever, irregular heartbeat, leg pain, lower extremity edema, malaise/fatigue, nausea, near-syncope, numbness, orthopnea, palpitations, PND, shortness of breath, syncope, vomiting or weakness. He has tried rest for the symptoms. The treatment provided significant relief. Risk factors include being elderly and male gender.  Pertinent negatives for past medical history include no anxiety/panic attacks, no aortic aneurysm, no aortic dissection, no arrhythmia, no congenital heart disease, no COPD, no CHF, no diabetes, no DVT, no hyperlipidemia, no hypertension, no MI, no PE, no sleep apnea, no spontaneous pneumothorax, no stimulant use, no strokes, no thyroid problem and no valve disorder.  His family medical history is significant for CAD, heart disease, hyperlipidemia and hypertension.  Pertinent negatives for family medical history include: no aortic dissection, no early MI, no PE, no PVD, no sudden death and no TIA. Prior diagnostic workup includes exercise treadmill test.  CT cardiac completed 2017: Coronary calcium score of 18. This was 37 percentile for age and sex matched control. Treadmill Stress test 2017: normal  Also reports left ear  fullness and decreased hearing. No swimming, no sinus congestion, no drainage, no tinnitus  Reviewed past Medical, Social and Family history today.  Outpatient Medications Prior to Visit  Medication Sig Dispense Refill  . aspirin EC 325 MG tablet Take 325 mg by mouth as needed for mild pain.    . B Complex Vitamins (VITAMIN-B COMPLEX PO) Take by mouth.    . Diclofenac Sodium 2 % SOLN Place 1 inch onto the skin 2 (two) times daily. 2 g 0  . FOLIC ACID PO Take by mouth.    . Omega-3 Fatty Acids (OMEGA 3 PO) Take 1 tablet by mouth daily.    Marland Kitchen OVER THE COUNTER MEDICATION Aerie's- otc for eyes.    . tamsulosin (FLOMAX) 0.4 MG CAPS capsule TAKE 1 CAPSULE BY MOUTH EVERY DAY 30 MINUTES BEFORE THE SAME MEAL  3   No facility-administered medications prior to visit.    ROS See HPI  Objective:  BP 120/68   Pulse (!) 55   Temp (!) 97.4 F (36.3 C) (Tympanic)   Ht 6' (1.829 m)   Wt 176 lb 12.8 oz (80.2 kg)   SpO2 97%   BMI 23.98 kg/m   BP Readings from Last 3 Encounters:  04/28/20 120/68  04/27/20 132/78  03/04/19 100/68    Wt Readings from Last 3 Encounters:  04/28/20 176 lb 12.8 oz (80.2 kg)  04/27/20 177 lb 6.4 oz (80.5 kg)  03/04/19 187 lb 6.4 oz (85 kg)    Physical Exam Vitals reviewed.  HENT:     Right Ear: Tympanic membrane, ear canal and external ear normal. There is no impacted cerumen.     Left Ear: Tympanic membrane, ear canal and external ear normal. There is impacted cerumen.  Ears:     Comments: Normal ear exam after cerumen removal Cardiovascular:     Rate and Rhythm: Normal rate and regular rhythm.     Pulses: Normal pulses.     Heart sounds: Normal heart sounds. No murmur. No friction rub. No gallop.   Pulmonary:     Effort: Pulmonary effort is normal.     Breath sounds: Normal breath sounds.  Chest:     Chest wall: No tenderness.  Abdominal:     General: Bowel sounds are normal.     Palpations: Abdomen is soft.  Musculoskeletal:     Cervical back:  Normal range of motion and neck supple.     Right lower leg: No edema.     Left lower leg: No edema.  Lymphadenopathy:     Cervical: No cervical adenopathy.  Neurological:     Mental Status: He is alert and oriented to person, place, and time.  Psychiatric:        Mood and Affect: Mood normal.        Behavior: Behavior normal.        Thought Content: Thought content normal.    Lab Results  Component Value Date   WBC 5.0 03/04/2019   HGB 14.9 03/04/2019   HCT 42.7 03/04/2019   PLT 161.0 03/04/2019   GLUCOSE 68 (L) 03/04/2019   CHOL 191 03/04/2019   TRIG 66.0 03/04/2019   HDL 59.20 03/04/2019   LDLCALC 119 (H) 03/04/2019   ALT 11 03/04/2019   AST 18 03/04/2019   NA 140 03/04/2019   K 4.4 03/04/2019   CL 105 03/04/2019   CREATININE 1.14 03/04/2019   BUN 24 (H) 03/04/2019   CO2 30 03/04/2019   PSA 0.59 03/04/2019   HGBA1C 5.5 03/04/2019   ECG: S. Bradycardia, no change compared to 2017.  Procedure Note :    Procedure :  Ear irrigation  Indication:  Cerumen impaction (left)  Risks, including pain, dizziness, eardrum perforation, bleeding, infection and others as well as benefits were explained to the patient in detail. Verbal consent was obtained and the patient agreed to proceed.   We used "The Elephant Ear Irrigation Device" filled with lukewarm water for irrigation. A large amount wax was recovered. Procedure has also required manual wax removal with an ear loop.  Tolerated well. Complications: None.  Postprocedure instructions :  Call if problems.  Assessment & Plan:  This visit occurred during the SARS-CoV-2 public health emergency.  Safety protocols were in place, including screening questions prior to the visit, additional usage of staff PPE, and extensive cleaning of exam room while observing appropriate contact time as indicated for disinfecting solutions.   Kial was seen today for follow-up.  Diagnoses and all orders for this visit:  Atypical chest pain -      EKG 12-Lead -     CBC w/Diff -     Comprehensive metabolic panel -     DG Chest 2 View -     Ambulatory referral to Cardiology -     nitroGLYCERIN (NITROSTAT) 0.4 MG SL tablet; Place 1 tablet (0.4 mg total) under the tongue every 5 (five) minutes as needed for chest pain. Do not use more than 4tabs  Hearing loss due to cerumen impaction, left  Encounter for lipid screening for cardiovascular disease -     Lipid panel  Prostate cancer screening -     PSA, Medicare   I am having Zachary Lawrence April. start on nitroGLYCERIN. I am  also having him maintain his aspirin EC, Omega-3 Fatty Acids (OMEGA 3 PO), tamsulosin, B Complex Vitamins (VITAMIN-B COMPLEX PO), OVER THE COUNTER MEDICATION, Diclofenac Sodium, and FOLIC ACID PO.  Meds ordered this encounter  Medications  . nitroGLYCERIN (NITROSTAT) 0.4 MG SL tablet    Sig: Place 1 tablet (0.4 mg total) under the tongue every 5 (five) minutes as needed for chest pain. Do not use more than 4tabs    Dispense:  20 tablet    Refill:  0    Order Specific Question:   Supervising Provider    Answer:   Ronnald Nian [id number]    Problem List Items Addressed This Visit    None    Visit Diagnoses    Atypical chest pain    -  Primary   Relevant Medications   nitroGLYCERIN (NITROSTAT) 0.4 MG SL tablet   Other Relevant Orders   EKG 12-Lead (Completed)   CBC w/Diff   Comprehensive metabolic panel   DG Chest 2 View (Completed)   Ambulatory referral to Cardiology   Hearing loss due to cerumen impaction, left       Encounter for lipid screening for cardiovascular disease       Relevant Orders   Lipid panel   Prostate cancer screening       Relevant Orders   PSA, Medicare      Follow-up: Return if symptoms worsen or fail to improve.  Wilfred Lacy, NP

## 2020-04-28 NOTE — Patient Instructions (Addendum)
No change in ECG compared to 2017.  I entered referral to cardiology for addition evaluation  Go to lab for blood draw and CXR.  Avoid strenuous exercise till we complete your cardiac workup.  Start aspirin EC 81mg  daily.  Go to ED if symptoms worsen.  Nitroglycerin sublingual tablets What is this medicine? NITROGLYCERIN (nye troe GLI ser in) is a type of vasodilator. It relaxes blood vessels, increasing the blood and oxygen supply to your heart. This medicine is used to relieve chest pain caused by angina. It is also used to prevent chest pain before activities like climbing stairs, going outdoors in cold weather, or sexual activity. This medicine may be used for other purposes; ask your health care provider or pharmacist if you have questions. COMMON BRAND NAME(S): Nitroquick, Nitrostat, Nitrotab What should I tell my health care provider before I take this medicine? They need to know if you have any of these conditions:  anemia  head injury, recent stroke, or bleeding in the brain  liver disease  previous heart attack  an unusual or allergic reaction to nitroglycerin, other medicines, foods, dyes, or preservatives  pregnant or trying to get pregnant  breast-feeding How should I use this medicine? Take this medicine by mouth as needed. At the first sign of an angina attack (chest pain or tightness) place one tablet under your tongue. You can also take this medicine 5 to 10 minutes before an event likely to produce chest pain. Follow the directions on the prescription label. Let the tablet dissolve under the tongue. Do not swallow whole. Replace the dose if you accidentally swallow it. It will help if your mouth is not dry. Saliva around the tablet will help it to dissolve more quickly. Do not eat or drink, smoke or chew tobacco while a tablet is dissolving. If you are not better within 5 minutes after taking ONE dose of nitroglycerin, call 9-1-1 immediately to seek emergency  medical care. Do not take more than 3 nitroglycerin tablets over 15 minutes. If you take this medicine often to relieve symptoms of angina, your doctor or health care professional may provide you with different instructions to manage your symptoms. If symptoms do not go away after following these instructions, it is important to call 9-1-1 immediately. Do not take more than 3 nitroglycerin tablets over 15 minutes. Talk to your pediatrician regarding the use of this medicine in children. Special care may be needed. Overdosage: If you think you have taken too much of this medicine contact a poison control center or emergency room at once. NOTE: This medicine is only for you. Do not share this medicine with others. What if I miss a dose? This does not apply. This medicine is only used as needed. What may interact with this medicine? Do not take this medicine with any of the following medications:  certain migraine medicines like ergotamine and dihydroergotamine (DHE)  medicines used to treat erectile dysfunction like sildenafil, tadalafil, and vardenafil  riociguat This medicine may also interact with the following medications:  alteplase  aspirin  heparin  medicines for high blood pressure  medicines for mental depression  other medicines used to treat angina  phenothiazines like chlorpromazine, mesoridazine, prochlorperazine, thioridazine This list may not describe all possible interactions. Give your health care provider a list of all the medicines, herbs, non-prescription drugs, or dietary supplements you use. Also tell them if you smoke, drink alcohol, or use illegal drugs. Some items may interact with your medicine. What should I  watch for while using this medicine? Tell your doctor or health care professional if you feel your medicine is no longer working. Keep this medicine with you at all times. Sit or lie down when you take your medicine to prevent falling if you feel dizzy or  faint after using it. Try to remain calm. This will help you to feel better faster. If you feel dizzy, take several deep breaths and lie down with your feet propped up, or bend forward with your head resting between your knees. You may get drowsy or dizzy. Do not drive, use machinery, or do anything that needs mental alertness until you know how this drug affects you. Do not stand or sit up quickly, especially if you are an older patient. This reduces the risk of dizzy or fainting spells. Alcohol can make you more drowsy and dizzy. Avoid alcoholic drinks. Do not treat yourself for coughs, colds, or pain while you are taking this medicine without asking your doctor or health care professional for advice. Some ingredients may increase your blood pressure. What side effects may I notice from receiving this medicine? Side effects that you should report to your doctor or health care professional as soon as possible:  blurred vision  dry mouth  skin rash  sweating  the feeling of extreme pressure in the head  unusually weak or tired Side effects that usually do not require medical attention (report to your doctor or health care professional if they continue or are bothersome):  flushing of the face or neck  headache  irregular heartbeat, palpitations  nausea, vomiting This list may not describe all possible side effects. Call your doctor for medical advice about side effects. You may report side effects to FDA at 1-800-FDA-1088. Where should I keep my medicine? Keep out of the reach of children. Store at room temperature between 20 and 25 degrees C (68 and 77 degrees F). Store in Chief of Staff. Protect from light and moisture. Keep tightly closed. Throw away any unused medicine after the expiration date. NOTE: This sheet is a summary. It may not cover all possible information. If you have questions about this medicine, talk to your doctor, pharmacist, or health care provider.  2020  Elsevier/Gold Standard (2013-09-24 17:57:36)

## 2020-04-29 LAB — COMPREHENSIVE METABOLIC PANEL
ALT: 9 U/L (ref 0–53)
AST: 16 U/L (ref 0–37)
Albumin: 4.4 g/dL (ref 3.5–5.2)
Alkaline Phosphatase: 48 U/L (ref 39–117)
BUN: 16 mg/dL (ref 6–23)
CO2: 29 mEq/L (ref 19–32)
Calcium: 9.4 mg/dL (ref 8.4–10.5)
Chloride: 105 mEq/L (ref 96–112)
Creatinine, Ser: 1.05 mg/dL (ref 0.40–1.50)
GFR: 69.97 mL/min (ref >60.00)
Glucose, Bld: 94 mg/dL (ref 70–99)
Potassium: 3.9 mEq/L (ref 3.5–5.1)
Sodium: 138 mEq/L (ref 135–145)
Total Bilirubin: 1.8 mg/dL — ABNORMAL HIGH (ref 0.2–1.2)
Total Protein: 6.1 g/dL (ref 6.0–8.3)

## 2020-04-29 LAB — CBC WITH DIFFERENTIAL/PLATELET
Basophils Absolute: 0.1 10*3/uL (ref 0.0–0.1)
Basophils Relative: 1.2 % (ref 0.0–3.0)
Eosinophils Absolute: 0.2 10*3/uL (ref 0.0–0.7)
Eosinophils Relative: 3.1 % (ref 0.0–5.0)
HCT: 40.1 % (ref 39.0–52.0)
Hemoglobin: 13.9 g/dL (ref 13.0–17.0)
Lymphocytes Relative: 28.9 % (ref 12.0–46.0)
Lymphs Abs: 1.7 10*3/uL (ref 0.7–4.0)
MCHC: 34.7 g/dL (ref 30.0–36.0)
MCV: 93.8 fl (ref 78.0–100.0)
Monocytes Absolute: 0.5 10*3/uL (ref 0.1–1.0)
Monocytes Relative: 9.3 % (ref 3.0–12.0)
Neutro Abs: 3.3 10*3/uL (ref 1.4–7.7)
Neutrophils Relative %: 57.5 % (ref 43.0–77.0)
Platelets: 144 10*3/uL — ABNORMAL LOW (ref 150.0–400.0)
RBC: 4.27 Mil/uL (ref 4.22–5.81)
RDW: 13.1 % (ref 11.5–15.5)
WBC: 5.8 10*3/uL (ref 4.0–10.5)

## 2020-04-29 LAB — LIPID PANEL
Cholesterol: 180 mg/dL (ref 0–200)
HDL: 59.6 mg/dL (ref >39.00)
LDL Cholesterol: 111 mg/dL — ABNORMAL HIGH (ref 0–99)
NonHDL: 120.37
Total CHOL/HDL Ratio: 3
Triglycerides: 49 mg/dL (ref 0.0–149.0)
VLDL: 9.8 mg/dL (ref 0.0–40.0)

## 2020-04-29 LAB — PSA, MEDICARE: PSA: 0.34 ng/ml (ref 0.10–4.00)

## 2020-05-10 ENCOUNTER — Encounter: Payer: Self-pay | Admitting: Cardiovascular Disease

## 2020-05-10 ENCOUNTER — Ambulatory Visit (INDEPENDENT_AMBULATORY_CARE_PROVIDER_SITE_OTHER): Payer: Medicare Other | Admitting: Cardiovascular Disease

## 2020-05-10 ENCOUNTER — Other Ambulatory Visit: Payer: Self-pay

## 2020-05-10 VITALS — BP 108/68 | HR 56 | Ht 72.0 in | Wt 176.6 lb

## 2020-05-10 DIAGNOSIS — R0600 Dyspnea, unspecified: Secondary | ICD-10-CM | POA: Diagnosis not present

## 2020-05-10 DIAGNOSIS — R0789 Other chest pain: Secondary | ICD-10-CM | POA: Insufficient documentation

## 2020-05-10 DIAGNOSIS — Z01812 Encounter for preprocedural laboratory examination: Secondary | ICD-10-CM

## 2020-05-10 DIAGNOSIS — Z01810 Encounter for preprocedural cardiovascular examination: Secondary | ICD-10-CM | POA: Diagnosis not present

## 2020-05-10 DIAGNOSIS — R0609 Other forms of dyspnea: Secondary | ICD-10-CM

## 2020-05-10 DIAGNOSIS — R072 Precordial pain: Secondary | ICD-10-CM | POA: Diagnosis not present

## 2020-05-10 NOTE — Assessment & Plan Note (Signed)
Patient complains of dyspnea exertion without a history of tobacco abuse.  Go to get a 2D echo to further evaluate

## 2020-05-10 NOTE — Patient Instructions (Addendum)
Medication Instructions:  Your physician recommends that you continue on your current medications as directed. Please refer to the Current Medication list given to you today.  *If you need a refill on your cardiac medications before your next appointment, please call your pharmacy*   Lab Work: BMET - complete 1 week prior to CT test  If you have labs (blood work) drawn today and your tests are completely normal, you will receive your results only by: Marland Kitchen MyChart Message (if you have MyChart) OR . A paper copy in the mail If you have any lab test that is abnormal or we need to change your treatment, we will call you to review the results.   Testing/Procedures: Echocardiogram @ [address] - 3rd Floor  Coronary CT @ [facility]   Follow-Up: At [facility], you and your health needs are our priority.  As part of our continuing mission to provide you with exceptional heart care, we have created designated Provider Care Teams.  These Care Teams include your primary Cardiologist (physician) and Advanced Practice Providers (APPs -  Physician Assistants and Nurse Practitioners) who all work together to provide you with the care you need, when you need it.  We recommend signing up for the patient portal called "MyChart".  Sign up information is provided on this After Visit Summary.  MyChart is used to connect with patients for Virtual Visits (Telemedicine).  Patients are able to view lab/test results, encounter notes, upcoming appointments, etc.  Non-urgent messages can be sent to your provider as well.   To learn more about what you can do with MyChart, go to [email or web address].    Your next appointment:   AS NEEDED with Dr. Gwenlyn Found    Other Instructions  Your cardiac CT will be scheduled at one of the below locations:   [facility] [address] [place], [place] [address] [phone]  [place] [address] [place], [place] [address] [phone]  If scheduled at [facility], please arrive at the [facility] main entrance of [facility] 30 minutes prior to test start time. Proceed to the [facility] Radiology Department (first floor) to check-in and test prep.  If scheduled at [facility], please arrive 15 mins early for check-in and test prep.  Please follow these instructions carefully (unless otherwise directed):  Hold all erectile dysfunction medications at least 3 days (72 hrs) prior to test.  On the Night Before the Test: . Be sure to Drink plenty of water. . Do not consume any caffeinated/decaffeinated beverages or chocolate 12 hours prior to your test. . Do not take any antihistamines 12 hours prior to your test.  On the Day of the Test: . Drink plenty of water. Do not drink any water within one hour of the test. . Do not eat any food 4 hours prior to the test. . You may take your regular medications prior to the test.   After the Test: . Drink plenty of water. . After receiving IV contrast, you may experience a mild flushed feeling. This is normal. . On occasion, you may experience a mild rash up to 24 hours after the test. This is not dangerous. If this occurs, you can take Benadryl 25 mg and increase your fluid intake. . If you experience trouble breathing, this can be serious. If it is severe call 911 IMMEDIATELY. If it is mild, please call our office.  Once we have confirmed authorization from your insurance company, we will call you to set up a date and time for your test.   For non-scheduling related questions, please contact the cardiac imaging nurse navigator should you have any questions/concerns: Marchia Bond, Cardiac Imaging Nurse Navigator Burley Saver, Interim Cardiac Imaging Nurse [place] [facility] Direct Office Dial: [phone]   For scheduling needs, including cancellations  and rescheduling, please call [phone].

## 2020-05-10 NOTE — Progress Notes (Signed)
05/10/2020 [name]   [birth year]  ER:[id number]  Primary Physician Nche, Charlene Brooke, NP Primary Cardiologist: Lorretta Harp MD [name], [name]  HPI:  Zachary Lawrence. is a 69 y.o. thin and fit appearing divorced Caucasian male father of 2, grandfather for grandchildren referred by Dr.Nche, his PCP, for evaluation of atypical chest pain.  He was evaluated 4 years ago by Dr. Percival Spanish with a negative GXT at that time for similar symptoms.  He works in Personal assistant.  His only risk factor is family history with father's who has had bypass surgery.  He is never had a heart attack or stroke.  He complains of quick fleeting sharp substernal chest pain that comes on for no apparent reason.  Also complains of some dyspnea exertion and dizziness.   Current Meds  Medication Sig  . aspirin EC 325 MG tablet Take 325 mg by mouth as needed for mild pain.  . B Complex Vitamins (VITAMIN-B COMPLEX PO) Take by mouth.  . FOLIC ACID PO Take by mouth.  . nitroGLYCERIN (NITROSTAT) 0.4 MG SL tablet Place 1 tablet (0.4 mg total) under the tongue every 5 (five) minutes as needed for chest pain. Do not use more than 4tabs  . Omega-3 Fatty Acids (OMEGA 3 PO) Take 1 tablet by mouth daily.  Marland Kitchen OVER THE COUNTER MEDICATION Aerie's- otc for eyes.  . tamsulosin (FLOMAX) 0.4 MG CAPS capsule TAKE 1 CAPSULE BY MOUTH EVERY DAY 30 MINUTES BEFORE THE SAME MEAL     No Known Allergies  Social History   Socioeconomic History  . Marital status: Legally Separated    Spouse name: Not on file  . Number of children: 2  . Years of education: Not on file  . Highest education level: Not on file  Occupational History  . Occupation: realtor  Tobacco Use  . Smoking status: Former Smoker    Types: Cigarettes    Quit date: 12/11/1975    Years since quitting: 44.4  . Smokeless tobacco: Never Used  Substance and Sexual Activity  . Alcohol use: Yes    Comment: social  . Drug use: No  . Sexual activity: Yes    Other Topics Concern  . Not on file  Social History Narrative   Lives alone.     Social Determinants of Health   Financial Resource Strain: Low Risk   . Difficulty of Paying Living Expenses: Not hard at all  Food Insecurity: No Food Insecurity  . Worried About Charity fundraiser in the Last Year: Never true  . Ran Out of Food in the Last Year: Never true  Transportation Needs: No Transportation Needs  . Lack of Transportation (Medical): No  . Lack of Transportation (Non-Medical): No  Physical Activity:   . Days of Exercise per Week:   . Minutes of Exercise per Session:   Stress:   . Feeling of Stress :   Social Connections:   . Frequency of Communication with Friends and Family:   . Frequency of Social Gatherings with Friends and Family:   . Attends Religious Services:   . Active Member of Clubs or Organizations:   . Attends Archivist Meetings:   Marland Kitchen Marital Status:   Intimate Partner Violence:   . Fear of Current or Ex-Partner:   . Emotionally Abused:   Marland Kitchen Physically Abused:   . Sexually Abused:      Review of Systems: General: negative for chills, fever, night sweats  or weight changes.  Cardiovascular: negative for chest pain, dyspnea on exertion, edema, orthopnea, palpitations, paroxysmal nocturnal dyspnea or shortness of breath Dermatological: negative for rash Respiratory: negative for cough or wheezing Urologic: negative for hematuria Abdominal: negative for nausea, vomiting, diarrhea, bright red blood per rectum, melena, or hematemesis Neurologic: negative for visual changes, syncope, or dizziness All other systems reviewed and are otherwise negative except as noted above.    Blood pressure 108/68, pulse (!) 56, height 6' (1.829 m), weight 176 lb 9.6 oz (80.1 kg), SpO2 98 %.  General appearance: alert and no distress Neck: no adenopathy, no carotid bruit, no JVD, supple, symmetrical, trachea midline and thyroid not enlarged, symmetric, no  tenderness/mass/nodules Lungs: clear to auscultation bilaterally Heart: regular rate and rhythm, S1, S2 normal, no murmur, click, rub or gallop Extremities: extremities normal, atraumatic, no cyanosis or edema Pulses: 2+ and symmetric Skin: Skin color, texture, turgor normal. No rashes or lesions Neurologic: Alert and oriented X 3, normal strength and tone. Normal symmetric reflexes. Normal coordination and gait  EKG is bradycardia at 56 without ST or T wave changes.  Personally reviewed this EKG.  ASSESSMENT AND PLAN:   Atypical chest pain Mr. Misko was referred by his PCP for evaluation of atypical chest pain.  He was seen by Dr. Percival Spanish remotely back in 2017 and had a negative GXT 03/27/2016.  He thinks the pain is gotten more frequent.  It is a sharp pain the last for seconds at a time not necessarily brought on by any activity.  His only risk factor is family history of a father who had bypass surgery and a still alive in his 2s.  I am going to get a coronary CTA to further evaluate.  Dyspnea on exertion Patient complains of dyspnea exertion without a history of tobacco abuse.  Go to get a 2D echo to further evaluate      Lorretta Harp MD [facility], [facility] 05/10/2020 2:17 PM

## 2020-05-10 NOTE — Assessment & Plan Note (Signed)
Mr. Sassaman was referred by his PCP for evaluation of atypical chest pain.  He was seen by Dr. Percival Spanish remotely back in 2017 and had a negative GXT 03/27/2016.  He thinks the pain is gotten more frequent.  It is a sharp pain the last for seconds at a time not necessarily brought on by any activity.  His only risk factor is family history of a father who had bypass surgery and a still alive in his 62s.  I am going to get a coronary CTA to further evaluate.

## 2020-05-24 DIAGNOSIS — R0600 Dyspnea, unspecified: Secondary | ICD-10-CM | POA: Diagnosis not present

## 2020-05-24 DIAGNOSIS — Z01812 Encounter for preprocedural laboratory examination: Secondary | ICD-10-CM | POA: Diagnosis not present

## 2020-05-24 DIAGNOSIS — R072 Precordial pain: Secondary | ICD-10-CM | POA: Diagnosis not present

## 2020-05-24 DIAGNOSIS — R0789 Other chest pain: Secondary | ICD-10-CM | POA: Diagnosis not present

## 2020-05-24 LAB — BASIC METABOLIC PANEL
BUN/Creatinine Ratio: 16 (ref 10–24)
BUN: 18 mg/dL (ref 8–27)
CO2: 25 mmol/L (ref 20–29)
Calcium: 9.6 mg/dL (ref 8.6–10.2)
Chloride: 102 mmol/L (ref 96–106)
Creatinine, Ser: 1.12 mg/dL (ref 0.76–1.27)
GFR calc Af Amer: 77 mL/min/{1.73_m2} (ref >59)
GFR calc non Af Amer: 67 mL/min/{1.73_m2} (ref >59)
Glucose: 96 mg/dL (ref 65–99)
Potassium: 4.8 mmol/L (ref 3.5–5.2)
Sodium: 141 mmol/L (ref 134–144)

## 2020-05-31 ENCOUNTER — Other Ambulatory Visit: Payer: Self-pay

## 2020-05-31 ENCOUNTER — Other Ambulatory Visit: Payer: Medicare Other

## 2020-05-31 ENCOUNTER — Other Ambulatory Visit: Payer: Self-pay | Admitting: *Deleted

## 2020-05-31 ENCOUNTER — Ambulatory Visit (HOSPITAL_COMMUNITY): Payer: Medicare Other | Attending: Cardiology

## 2020-05-31 DIAGNOSIS — Z01812 Encounter for preprocedural laboratory examination: Secondary | ICD-10-CM

## 2020-05-31 DIAGNOSIS — R0789 Other chest pain: Secondary | ICD-10-CM

## 2020-05-31 DIAGNOSIS — R0600 Dyspnea, unspecified: Secondary | ICD-10-CM | POA: Diagnosis present

## 2020-05-31 DIAGNOSIS — R0609 Other forms of dyspnea: Secondary | ICD-10-CM

## 2020-05-31 DIAGNOSIS — R072 Precordial pain: Secondary | ICD-10-CM

## 2020-05-31 LAB — BASIC METABOLIC PANEL
BUN/Creatinine Ratio: 18 (ref 10–24)
BUN: 22 mg/dL (ref 8–27)
CO2: 24 mmol/L (ref 20–29)
Calcium: 9.4 mg/dL (ref 8.6–10.2)
Chloride: 103 mmol/L (ref 96–106)
Creatinine, Ser: 1.22 mg/dL (ref 0.76–1.27)
GFR calc Af Amer: 69 mL/min/{1.73_m2} (ref >59)
GFR calc non Af Amer: 60 mL/min/{1.73_m2} (ref >59)
Glucose: 108 mg/dL — ABNORMAL HIGH (ref 65–99)
Potassium: 5 mmol/L (ref 3.5–5.2)
Sodium: 138 mmol/L (ref 134–144)

## 2020-06-02 ENCOUNTER — Telehealth (HOSPITAL_COMMUNITY): Payer: Self-pay | Admitting: *Deleted

## 2020-06-02 NOTE — Telephone Encounter (Signed)
Attempted to call patient regarding upcoming cardiac CT appointment. Left message on voicemail with name and callback number  Rehman Levinson Tai RN Navigator Cardiac Imaging [facility] [phone] Office [phone] Cell  

## 2020-06-02 NOTE — Telephone Encounter (Signed)
Patient returning call regarding upcoming cardiac imaging study; pt verbalizes understanding of appt date/time, parking situation and where to check in, pre-test NPO status and medications ordered, and verified current allergies; name and call back number provided for further questions should they arise  Jamaree Hosier Tai RN Navigator Cardiac Imaging [facility] [phone] office [phone] cell  

## 2020-06-06 ENCOUNTER — Other Ambulatory Visit: Payer: Self-pay

## 2020-06-06 ENCOUNTER — Ambulatory Visit (HOSPITAL_COMMUNITY)
Admission: RE | Admit: 2020-06-06 | Discharge: 2020-06-06 | Disposition: A | Discharge status: Home / Self Care | Payer: Medicare Other | Source: Ambulatory Visit | Attending: Cardiovascular Disease | Admitting: Cardiovascular Disease

## 2020-06-06 DIAGNOSIS — I2510 Atherosclerotic heart disease of native coronary artery without angina pectoris: Secondary | ICD-10-CM | POA: Diagnosis not present

## 2020-06-06 DIAGNOSIS — R0600 Dyspnea, unspecified: Secondary | ICD-10-CM | POA: Insufficient documentation

## 2020-06-06 DIAGNOSIS — R0789 Other chest pain: Secondary | ICD-10-CM | POA: Diagnosis not present

## 2020-06-06 DIAGNOSIS — R072 Precordial pain: Secondary | ICD-10-CM | POA: Insufficient documentation

## 2020-06-06 MED ORDER — NITROGLYCERIN 0.4 MG SL SUBL
SUBLINGUAL_TABLET | SUBLINGUAL | Status: AC
  Filled 2020-06-06: qty 2

## 2020-06-06 MED ORDER — NITROGLYCERIN 0.4 MG SL SUBL
0.8000 mg | SUBLINGUAL_TABLET | Freq: Once | SUBLINGUAL | Status: AC
  Administered 2020-06-06: 0.8 mg via SUBLINGUAL

## 2020-06-06 MED ORDER — IOHEXOL 350 MG/ML SOLN
80.0000 mL | Freq: Once | INTRAVENOUS | Status: AC | PRN
  Administered 2020-06-06: 80 mL via INTRAVENOUS

## 2020-06-08 ENCOUNTER — Ambulatory Visit (HOSPITAL_COMMUNITY): Admission: RE | Admit: 2020-06-08 | Payer: Medicare Other | Source: Ambulatory Visit

## 2020-06-08 DIAGNOSIS — I2510 Atherosclerotic heart disease of native coronary artery without angina pectoris: Secondary | ICD-10-CM | POA: Diagnosis not present

## 2020-06-27 ENCOUNTER — Telehealth: Payer: Self-pay | Admitting: Cardiovascular Disease

## 2020-06-27 NOTE — Telephone Encounter (Signed)
° ° ° °  Pt is calling about results for echo and CT. He saw it on his mychart one of the notes it says "medical management recommended" he wanted to know what it means

## 2020-06-27 NOTE — Telephone Encounter (Signed)
Pt was returning phone call regarding his test results. Please call back

## 2020-06-27 NOTE — Telephone Encounter (Signed)
Returned call to patient-discussed CTA and echo results with patient.  He was unsure what medical management meant because he is not on any medication other than vitamins.   He reports he is very active, [place] and also goes to the gym 2-3x weekly.  He also is very conscious of what he eats/diet.   Recent lab work 5/20-LDL 111 by PCP.    He also reports ongoing concerns with episodes of lightheadedness, feeling like he is going to pass out.   He states this has increased in frequency and can occur multiple times daily.   States this takes him down to "one knee" in order to prevent himself from falling or passing out.  Denies dizziness, palpitations, tunnel vision.   Reports blurry vision during these episodes as well as a short, sharp pain in his chest.   No SOB.    Does not check BP or HR at home-reports his typically is on the lower end (at OV 108/68 HR 56).  Advised would be good idea to have BP/HR monitor to see what BP and HR are during these episodes.    appt scheduled to see Dr. Gwenlyn Found back-f/u CTA and ongoing concerns.   Patient aware and verbalized understanding.    Routed to MD to make aware and review for any medication changes prior to OV.

## 2020-06-27 NOTE — Telephone Encounter (Signed)
Left message for pt to call.

## 2020-06-28 NOTE — Telephone Encounter (Signed)
We will discuss results of his noninvasive test including 2D echo and coronary CTA when I see him back in the office.

## 2020-07-08 DIAGNOSIS — H04123 Dry eye syndrome of bilateral lacrimal glands: Secondary | ICD-10-CM | POA: Diagnosis not present

## 2020-07-08 DIAGNOSIS — H2513 Age-related nuclear cataract, bilateral: Secondary | ICD-10-CM | POA: Diagnosis not present

## 2020-07-12 DIAGNOSIS — N401 Enlarged prostate with lower urinary tract symptoms: Secondary | ICD-10-CM | POA: Diagnosis not present

## 2020-07-12 DIAGNOSIS — N138 Other obstructive and reflux uropathy: Secondary | ICD-10-CM | POA: Diagnosis not present

## 2020-07-14 ENCOUNTER — Other Ambulatory Visit: Payer: Self-pay

## 2020-07-14 ENCOUNTER — Encounter: Payer: Self-pay | Admitting: *Deleted

## 2020-07-14 ENCOUNTER — Encounter: Payer: Self-pay | Admitting: Cardiovascular Disease

## 2020-07-14 ENCOUNTER — Ambulatory Visit (INDEPENDENT_AMBULATORY_CARE_PROVIDER_SITE_OTHER): Payer: Medicare Other | Admitting: Cardiovascular Disease

## 2020-07-14 VITALS — BP 113/69 | HR 62 | Ht 72.0 in | Wt 179.8 lb

## 2020-07-14 DIAGNOSIS — R42 Dizziness and giddiness: Secondary | ICD-10-CM | POA: Diagnosis not present

## 2020-07-14 DIAGNOSIS — E785 Hyperlipidemia, unspecified: Secondary | ICD-10-CM

## 2020-07-14 MED ORDER — ATORVASTATIN CALCIUM 20 MG PO TABS: 20.0000 mg | ORAL_TABLET | Freq: Every day | ORAL | 3 refills | Status: DC

## 2020-07-14 NOTE — Progress Notes (Signed)
Mr. Kervin returns today for follow-up of his noninvasive test.  2D echo revealed normal LV function, grade 1 diastolic dysfunction with mild MR.  His coronary CTA revealed distal LAD disease probably of no clinical significance.  His LDL was 111 and because of this and his elevated coronary calcium score 60 I am going to begin him on atorvastatin 20 mg a day and we will recheck a lipid liver profile in 2 months shooting for a LDL less than 70.  Does complain of daily dizziness and because of this we will place a 1 week Zio patch to rule out as a arrhythmogenic etiology.  I will see him back in 3 months for follow-up.  Lorretta Harp, M.D., [place], [facility], [place] [address] [place], [place]  [address]  [phone] 07/14/2020 4:33 PM

## 2020-07-14 NOTE — Progress Notes (Signed)
Patient ID: Zachary Lawrence., male   DOB: Nov 19, 1951, 69 y.o.   MRN: [id number] Patient enrolled for [employer] to ship a 7 day ZIO XT long term holter monitor to his home.

## 2020-07-14 NOTE — Patient Instructions (Addendum)
Medication Instructions:   START Atorvastatin (Lipitor) 20 mg daily *If you need a refill on your cardiac medications before your next appointment, please call your pharmacy*  Lab Work: Your physician recommends that you return for lab work in 2 months:  FASTING LIPID PANEL-DO NOT EAT OR DRINK PAST MIDNIGHT. OKAY TO HAVE WATER.   HEPATIC (LIVER) FUNCTION TEST  If you have labs (blood work) drawn today and your tests are completely normal, you will receive your results only by: Marland Kitchen MyChart Message (if you have MyChart) OR . A paper copy in the mail If you have any lab test that is abnormal or we need to change your treatment, we will call you to review the results.  Testing/Procedures: [place]- Long Term Monitor Instructions   Your physician has requested you wear your ZIO patch monitor 7 days.   This is a single patch monitor.  [employer] supplies one patch monitor per enrollment.  Additional stickers are not available.   Please do not apply patch if you will be having a Nuclear Stress Test, Echocardiogram, Cardiac CT, MRI, or Chest Xray during the time frame you would be wearing the monitor. The patch cannot be worn during these tests.  You cannot remove and re-apply the ZIO XT patch monitor.   Your ZIO patch monitor will be sent USPS Priority mail from [facility] directly to your home address. The monitor may also be mailed to a PO BOX if home delivery is not available.   It may take 3-5 days to receive your monitor after you have been enrolled.   Once you have received you monitor, please review enclosed instructions.  Your monitor has already been registered assigning a specific monitor serial # to you.   Applying the monitor   Shave hair from upper left chest.   Hold abrader disc by orange tab.  Rub abrader in 40 strokes over left upper chest as indicated in your monitor instructions.   Clean area with 4 enclosed alcohol pads .  Use all pads to assure are is cleaned  thoroughly.  Let dry.   Apply patch as indicated in monitor instructions.  Patch will be place under collarbone on left side of chest with arrow pointing upward.   Rub patch adhesive wings for 2 minutes.Remove white label marked "1".  Remove white label marked "2".  Rub patch adhesive wings for 2 additional minutes.   While looking in a mirror, press and release button in center of patch.  A small green light will flash 3-4 times .  This will be your only indicator the monitor has been turned on.     Do not shower for the first 24 hours.  You may shower after the first 24 hours.   Press button if you feel a symptom. You will hear a small click.  Record Date, Time and Symptom in the Patient Log Book.   When you are ready to remove patch, follow instructions on last 2 pages of Patient Log Book.  Stick patch monitor onto last page of Patient Log Book.   Place Patient Log Book in [place] box.  Use locking tab on box and tape box closed securely.  The [employer] and [employer] has [employer] on it.  Please place in mailbox as soon as possible.  Your physician should have your test results approximately 7 days after the monitor has been mailed back to [facility].   Call [place] at [phone] if you have questions  regarding your ZIO XT patch monitor.  Call them immediately if you see an orange light blinking on your monitor.   If your monitor falls off in less than 4 days contact our Monitor department at [phone].  If your monitor becomes loose or falls off after 4 days call [employer] at [phone] for suggestions on securing your monitor.    Follow-Up: At [facility], you and your health needs are our priority.  As part of our continuing mission to provide you with exceptional heart care, we have created designated Provider Care Teams.  These Care Teams include your primary Cardiologist (physician) and Advanced Practice Providers (APPs -  Physician Assistants and  Nurse Practitioners) who all work together to provide you with the care you need, when you need it.  Your next appointment:   3 month(s)  The format for your next appointment:   In Person  Provider:   Quay Burow, MD  Other Instructions

## 2020-07-18 ENCOUNTER — Ambulatory Visit (INDEPENDENT_AMBULATORY_CARE_PROVIDER_SITE_OTHER): Payer: Medicare Other

## 2020-07-18 DIAGNOSIS — R42 Dizziness and giddiness: Secondary | ICD-10-CM | POA: Diagnosis not present

## 2020-07-27 ENCOUNTER — Ambulatory Visit (INDEPENDENT_AMBULATORY_CARE_PROVIDER_SITE_OTHER): Payer: Medicare Other | Admitting: Nurse Practitioner

## 2020-07-27 ENCOUNTER — Encounter: Payer: Self-pay | Admitting: Nurse Practitioner

## 2020-07-27 ENCOUNTER — Other Ambulatory Visit: Payer: Self-pay

## 2020-07-27 VITALS — BP 110/64 | HR 68 | Temp 97.3°F | Ht 72.0 in | Wt 183.2 lb

## 2020-07-27 DIAGNOSIS — L739 Follicular disorder, unspecified: Secondary | ICD-10-CM | POA: Diagnosis not present

## 2020-07-27 MED ORDER — HIBICLENS 4 % EX LIQD: CUTANEOUS | 0 refills | Status: DC

## 2020-07-27 MED ORDER — CEPHALEXIN 500 MG PO CAPS: 500.0000 mg | ORAL_CAPSULE | Freq: Three times a day (TID) | ORAL | 0 refills | Status: DC

## 2020-07-27 NOTE — Patient Instructions (Signed)

## 2020-07-27 NOTE — Progress Notes (Signed)
Subjective:  Patient ID: Zachary Lawrence., male    DOB: January 18, 1951  Age: 69 y.o. MRN: [id number]  CC: Insect Bite (What appears to be insect bites on his arms, legs, and belt area x2 weeks. Pt states two weeks ago he saw the bites but they went away and returned this Monday. Areas itch)  Rash This is a recurrent problem. The current episode started 1 to 4 weeks ago. The problem has been waxing and waning since onset. The affected locations include the groin, left upper leg and right upper leg. The rash is characterized by itchiness and redness. He was exposed to nothing. Pertinent negatives include no fatigue, fever, joint pain or nail changes. Past treatments include nothing.  exercise 2-3x/week at gym.  Also does yard work every weekend. Reports she does not always changes immediately after completion of above activities. Admits to perfuse sweating during workout and yard word.  Reviewed past Medical, Social and Family history today.  Outpatient Medications Prior to Visit  Medication Sig Dispense Refill  . aspirin EC 325 MG tablet Take 325 mg by mouth as needed for mild pain.    Marland Kitchen atorvastatin (LIPITOR) 20 MG tablet Take 1 tablet (20 mg total) by mouth daily. 90 tablet 3  . B Complex Vitamins (VITAMIN-B COMPLEX PO) Take by mouth.    . FOLIC ACID PO Take by mouth.    . nitroGLYCERIN (NITROSTAT) 0.4 MG SL tablet Place 1 tablet (0.4 mg total) under the tongue every 5 (five) minutes as needed for chest pain. Do not use more than 4tabs 20 tablet 0  . Omega-3 Fatty Acids (OMEGA 3 PO) Take 1 tablet by mouth daily.    Marland Kitchen OVER THE COUNTER MEDICATION Aerie's- otc for eyes.    . tamsulosin (FLOMAX) 0.4 MG CAPS capsule TAKE 1 CAPSULE BY MOUTH EVERY DAY 30 MINUTES BEFORE THE SAME MEAL  3   No facility-administered medications prior to visit.    ROS See HPI  Objective:  BP 110/64 (BP Location: Right Arm, Patient Position: Sitting, Cuff Size: Normal)   Pulse 68   Temp (!) 97.3 F (36.3 C)  (Temporal)   Ht 6' (1.829 m)   Wt 183 lb 3.2 oz (83.1 kg)   SpO2 99%   BMI 24.85 kg/m   Physical Exam Vitals reviewed.  Skin:    Findings: Erythema and rash present. Rash is papular and pustular.          Comments: Discrete papules and pustules on thigh and groin region  Neurological:     Mental Status: He is alert and oriented to person, place, and time.    Assessment & Plan:  This visit occurred during the SARS-CoV-2 public health emergency.  Safety protocols were in place, including screening questions prior to the visit, additional usage of staff PPE, and extensive cleaning of exam room while observing appropriate contact time as indicated for disinfecting solutions.   [name] was seen today for insect bite.  Diagnoses and all orders for this visit:  Folliculitis -     chlorhexidine (HIBICLENS) 4 % external liquid; 2-3times a week x 2weeks, then once a week as needed -     cephALEXin (KEFLEX) 500 MG capsule; Take 1 capsule (500 mg total) by mouth 3 (three) times daily.  Advised to remove wet or damp clothing immediately after completion of activity.  Problem List Items Addressed This Visit    None    Visit Diagnoses    Folliculitis    -  Primary   Relevant Medications   chlorhexidine (HIBICLENS) 4 % external liquid   cephALEXin (KEFLEX) 500 MG capsule      Follow-up: No follow-ups on file.  Wilfred Lacy, NP

## 2020-08-04 DIAGNOSIS — R42 Dizziness and giddiness: Secondary | ICD-10-CM | POA: Diagnosis not present

## 2020-08-29 ENCOUNTER — Telehealth: Payer: Self-pay | Admitting: Cardiovascular Disease

## 2020-08-29 DIAGNOSIS — T466X5A Adverse effect of antihyperlipidemic and antiarteriosclerotic drugs, initial encounter: Secondary | ICD-10-CM

## 2020-08-29 DIAGNOSIS — E785 Hyperlipidemia, unspecified: Secondary | ICD-10-CM

## 2020-08-29 NOTE — Telephone Encounter (Signed)
Thanks .. will be happy to see him!  -[place]

## 2020-08-29 NOTE — Telephone Encounter (Signed)
Thanks [place]  JJB

## 2020-08-29 NOTE — Telephone Encounter (Signed)
Statin intol and not at goal for secondary prevention given elevated Cor CA Score. Refer to Dr Debara Pickett

## 2020-08-29 NOTE — Telephone Encounter (Signed)
Referral to lipid clinic - Dr. Debara Pickett - ordered.   Message sent to scheduling pool to arrange appointment

## 2020-08-29 NOTE — Telephone Encounter (Signed)
[id number] 

## 2020-08-29 NOTE — Telephone Encounter (Signed)
Returned call to pt he states he has been having body aches and "feels like he has the flu". Pt will stop Lipitor and let us know if this is what is causing the body aches. Verbalizes understanding. Will CB with results.  Please advise if you want to change cholesterol medication.

## 2020-08-29 NOTE — Telephone Encounter (Signed)
New Message  Pt c/o medication issue:  1. Name of Medication: atorvastatin (LIPITOR) 20 MG tablet [[id number]]    2. How are you currently taking this medication (dosage and times per day)? Take 1 tablet (20 mg total) by mouth daily.  3. Are you having a reaction (difficulty breathing--STAT)?  No  4. What is your medication issue? Pt called in and stated that this med is cause his body to ache.  He stated that it has been aching so bad that he is not able to sleep.  He stated he would like to stop taking this med.  Please advise   Best number --[phone]

## 2020-09-14 DIAGNOSIS — H2511 Age-related nuclear cataract, right eye: Secondary | ICD-10-CM | POA: Diagnosis not present

## 2020-09-14 DIAGNOSIS — H25811 Combined forms of age-related cataract, right eye: Secondary | ICD-10-CM | POA: Diagnosis not present

## 2020-09-29 ENCOUNTER — Encounter: Payer: Self-pay | Admitting: Nurse Practitioner

## 2020-09-29 NOTE — Telephone Encounter (Signed)
Please see message and advise.  Thank you. ° °

## 2020-10-04 DIAGNOSIS — N390 Urinary tract infection, site not specified: Secondary | ICD-10-CM | POA: Diagnosis not present

## 2020-10-11 ENCOUNTER — Other Ambulatory Visit: Payer: Self-pay

## 2020-10-11 ENCOUNTER — Ambulatory Visit (INDEPENDENT_AMBULATORY_CARE_PROVIDER_SITE_OTHER): Payer: Medicare Other | Admitting: Cardiovascular Disease

## 2020-10-11 ENCOUNTER — Encounter: Payer: Self-pay | Admitting: Cardiovascular Disease

## 2020-10-11 VITALS — BP 100/60 | HR 72 | Ht 72.0 in | Wt 181.0 lb

## 2020-10-11 DIAGNOSIS — R0789 Other chest pain: Secondary | ICD-10-CM

## 2020-10-11 DIAGNOSIS — E785 Hyperlipidemia, unspecified: Secondary | ICD-10-CM | POA: Insufficient documentation

## 2020-10-11 DIAGNOSIS — E782 Mixed hyperlipidemia: Secondary | ICD-10-CM | POA: Diagnosis not present

## 2020-10-11 DIAGNOSIS — R42 Dizziness and giddiness: Secondary | ICD-10-CM

## 2020-10-11 NOTE — Assessment & Plan Note (Signed)
History of atypical chest pain that does not sound anginal.  He did have coronary CTA performed that showed a coronary calcium score of 111 with some distal LAD disease probably of no clinical significance.

## 2020-10-11 NOTE — Patient Instructions (Signed)
Medication Instructions:  Your physician recommends that you continue on your current medications as directed. Please refer to the Current Medication list given to you today.  *If you need a refill on your cardiac medications before your next appointment, please call your pharmacy*  Testing/Procedures: Your physician has requested that you have a carotid duplex. This test is an ultrasound of the carotid arteries in your neck. It looks at blood flow through these arteries that supply the brain with blood. Allow one hour for this exam. There are no restrictions or special instructions.  Follow-Up: At [facility], you and your health needs are our priority.  As part of our continuing mission to provide you with exceptional heart care, we have created designated Provider Care Teams.  These Care Teams include your primary Cardiologist (physician) and Advanced Practice Providers (APPs -  Physician Assistants and Nurse Practitioners) who all work together to provide you with the care you need, when you need it.  We recommend signing up for the patient portal called "MyChart".  Sign up information is provided on this After Visit Summary.  MyChart is used to connect with patients for Virtual Visits (Telemedicine).  Patients are able to view lab/test results, encounter notes, upcoming appointments, etc.  Non-urgent messages can be sent to your provider as well.   To learn more about what you can do with MyChart, go to [email or web address].    Your next appointment:   6 month(s)  The format for your next appointment:   In Person  Provider:   Quay Burow, MD   Other Instructions Keep upcoming appointment with Dr. Debara Pickett for cholesterol management

## 2020-10-11 NOTE — Assessment & Plan Note (Signed)
History of episodic dizziness that occurs fairly frequently.  This is unpredictable.  Is good has not led to syncope.  He runs a low blood pressure anyway.  We did get a 2-week Zio patch that showed short runs of SVT that I do not think are related.  He also told me that he rides motorcycles and I suggested that he abstain.

## 2020-10-11 NOTE — Progress Notes (Signed)
10/11/2020 Zachary Lawrence.   1951/05/10  [id number]  Primary Physician Nche, Charlene Brooke, NP Primary Cardiologist: Lorretta Harp MD [name], [name]  HPI:  Zachary Lawrence. is a 69 y.o.  thin and fit appearing divorced Caucasian male father of 2, grandfather for grandchildren referred by Dr.Nche, his PCP, for evaluation of atypical chest pain.  I last saw him in the office 07/14/2020.  Nuclear was prepped He was evaluated 4 years ago by Dr. Percival Spanish with a negative GXT at that time for similar symptoms.  He works in Personal assistant.  His only risk factor is family history with father's who has had bypass surgery.  He is never had a heart attack or stroke.  He complains of quick fleeting sharp substernal chest pain that comes on for no apparent reason.  Also complains of some dyspnea exertion and dizziness.  He had a coronary CTA that revealed a coronary calcium score of 60 with distal LAD disease probably of no clinical significance.  A 2D echocardiogram performed 05/31/2020 revealed normal LV systolic function with grade 1 diastolic dysfunction and no valvular abnormalities other than mild MR.  Because of his episodic dizziness I did a 2-week Zio patch that showed short runs of SVT probably noncontributory.  I did begin him on low-dose atorvastatin because of an LDL of 111 to intensify his primary prevention which she did not tolerate.   Current Meds  Medication Sig  . aspirin EC 325 MG tablet Take 325 mg by mouth as needed for mild pain.  Marland Kitchen atorvastatin (LIPITOR) 20 MG tablet Take 1 tablet (20 mg total) by mouth daily.  . B Complex Vitamins (VITAMIN-B COMPLEX PO) Take by mouth.  . cephALEXin (KEFLEX) 500 MG capsule Take 1 capsule (500 mg total) by mouth 3 (three) times daily.  . chlorhexidine (HIBICLENS) 4 % external liquid 2-3times a week x 2weeks, then once a week as needed  . FOLIC ACID PO Take by mouth.  . nitroGLYCERIN (NITROSTAT) 0.4 MG SL tablet Place 1 tablet (0.4 mg  total) under the tongue every 5 (five) minutes as needed for chest pain. Do not use more than 4tabs  . Omega-3 Fatty Acids (OMEGA 3 PO) Take 1 tablet by mouth daily.  Marland Kitchen OVER THE COUNTER MEDICATION Aerie's- otc for eyes.  . tamsulosin (FLOMAX) 0.4 MG CAPS capsule TAKE 1 CAPSULE BY MOUTH EVERY DAY 30 MINUTES BEFORE THE SAME MEAL     No Known Allergies  Social History   Socioeconomic History  . Marital status: Divorced    Spouse name: Not on file  . Number of children: 2  . Years of education: Not on file  . Highest education level: Not on file  Occupational History  . Occupation: realtor  Tobacco Use  . Smoking status: Former Smoker    Types: Cigarettes    Quit date: 12/11/1975    Years since quitting: 44.8  . Smokeless tobacco: Never Used  Vaping Use  . Vaping Use: Never used  Substance and Sexual Activity  . Alcohol use: Yes    Comment: social  . Drug use: No  . Sexual activity: Yes  Other Topics Concern  . Not on file  Social History Narrative   Lives alone.     Social Determinants of Health   Financial Resource Strain: Low Risk   . Difficulty of Paying Living Expenses: Not hard at all  Food Insecurity: No Food Insecurity  . Worried About Estate manager/land agent  of Food in the Last Year: Never true  . Ran Out of Food in the Last Year: Never true  Transportation Needs: No Transportation Needs  . Lack of Transportation (Medical): No  . Lack of Transportation (Non-Medical): No  Physical Activity:   . Days of Exercise per Week: Not on file  . Minutes of Exercise per Session: Not on file  Stress:   . Feeling of Stress : Not on file  Social Connections:   . Frequency of Communication with Friends and Family: Not on file  . Frequency of Social Gatherings with Friends and Family: Not on file  . Attends Religious Services: Not on file  . Active Member of Clubs or Organizations: Not on file  . Attends Archivist Meetings: Not on file  . Marital Status: Not on file    Intimate Partner Violence:   . Fear of Current or Ex-Partner: Not on file  . Emotionally Abused: Not on file  . Physically Abused: Not on file  . Sexually Abused: Not on file     Review of Systems: General: negative for chills, fever, night sweats or weight changes.  Cardiovascular: negative for chest pain, dyspnea on exertion, edema, orthopnea, palpitations, paroxysmal nocturnal dyspnea or shortness of breath Dermatological: negative for rash Respiratory: negative for cough or wheezing Urologic: negative for hematuria Abdominal: negative for nausea, vomiting, diarrhea, bright red blood per rectum, melena, or hematemesis Neurologic: negative for visual changes, syncope, or dizziness All other systems reviewed and are otherwise negative except as noted above.    Blood pressure 100/60, pulse 72, height 6' (1.829 m), weight 181 lb (82.1 kg).  General appearance: alert and no distress Neck: no adenopathy, no carotid bruit, no JVD, supple, symmetrical, trachea midline and thyroid not enlarged, symmetric, no tenderness/mass/nodules Lungs: clear to auscultation bilaterally Heart: regular rate and rhythm, S1, S2 normal, no murmur, click, rub or gallop Extremities: extremities normal, atraumatic, no cyanosis or edema Pulses: 2+ and symmetric Skin: Skin color, texture, turgor normal. No rashes or lesions Neurologic: Alert and oriented X 3, normal strength and tone. Normal symmetric reflexes. Normal coordination and gait  EKG not performed today  ASSESSMENT AND PLAN:   Dizziness History of episodic dizziness that occurs fairly frequently.  This is unpredictable.  Is good has not led to syncope.  He runs a low blood pressure anyway.  We did get a 2-week Zio patch that showed short runs of SVT that I do not think are related.  He also told me that he rides motorcycles and I suggested that he abstain.  Atypical chest pain History of atypical chest pain that does not sound anginal.  He did  have coronary CTA performed that showed a coronary calcium score of 111 with some distal LAD disease probably of no clinical significance.  Hyperlipidemia Patient hyperlipidemia with lipid profile performed 04/28/2020 revealing total cholesterol 180, LDL of 111 and HDL of 59.  Because of his moderately elevated coronary calcium score I did begin him on low-dose atorvastatin which she did not tolerate.  I made him an appointment to see Dr. Debara Pickett for discussion regarding alternative pharmacologic intervention such as PCSK9.      Lorretta Harp MD FACP,FACC,FAHA, [facility] 10/11/2020 9:11 AM

## 2020-10-11 NOTE — Assessment & Plan Note (Signed)
Patient hyperlipidemia with lipid profile performed 04/28/2020 revealing total cholesterol 180, LDL of 111 and HDL of 59.  Because of his moderately elevated coronary calcium score I did begin him on low-dose atorvastatin which she did not tolerate.  I made him an appointment to see Dr. Debara Pickett for discussion regarding alternative pharmacologic intervention such as PCSK9.

## 2020-10-18 DIAGNOSIS — R3589 Other polyuria: Secondary | ICD-10-CM | POA: Diagnosis not present

## 2020-10-18 DIAGNOSIS — N401 Enlarged prostate with lower urinary tract symptoms: Secondary | ICD-10-CM | POA: Diagnosis not present

## 2020-10-18 DIAGNOSIS — N138 Other obstructive and reflux uropathy: Secondary | ICD-10-CM | POA: Diagnosis not present

## 2020-10-19 ENCOUNTER — Ambulatory Visit (HOSPITAL_COMMUNITY)
Admission: RE | Admit: 2020-10-19 | Discharge: 2020-10-19 | Disposition: A | Discharge status: Home / Self Care | Payer: Medicare Other | Source: Ambulatory Visit | Attending: Internal Medicine | Admitting: Internal Medicine

## 2020-10-19 DIAGNOSIS — R42 Dizziness and giddiness: Secondary | ICD-10-CM | POA: Diagnosis not present

## 2020-10-26 ENCOUNTER — Ambulatory Visit (INDEPENDENT_AMBULATORY_CARE_PROVIDER_SITE_OTHER): Payer: Medicare Other | Admitting: Family

## 2020-10-26 ENCOUNTER — Telehealth: Payer: Self-pay | Admitting: Nurse Practitioner

## 2020-10-26 ENCOUNTER — Other Ambulatory Visit: Payer: Self-pay

## 2020-10-26 ENCOUNTER — Encounter: Payer: Self-pay | Admitting: Family

## 2020-10-26 VITALS — BP 90/63 | HR 83 | Temp 98.1°F | Resp 16 | Ht 72.0 in | Wt 179.0 lb

## 2020-10-26 DIAGNOSIS — R519 Headache, unspecified: Secondary | ICD-10-CM | POA: Diagnosis not present

## 2020-10-26 DIAGNOSIS — I951 Orthostatic hypotension: Secondary | ICD-10-CM

## 2020-10-26 DIAGNOSIS — H5712 Ocular pain, left eye: Secondary | ICD-10-CM

## 2020-10-26 DIAGNOSIS — T1512XA Foreign body in conjunctival sac, left eye, initial encounter: Secondary | ICD-10-CM | POA: Diagnosis not present

## 2020-10-26 DIAGNOSIS — H16102 Unspecified superficial keratitis, left eye: Secondary | ICD-10-CM | POA: Diagnosis not present

## 2020-10-26 NOTE — Telephone Encounter (Signed)
ok 

## 2020-10-26 NOTE — Telephone Encounter (Signed)
Patient saw Jeri LagerConley Canal today in reference eye pain, patient went to the eye doctor  After seeing  Debbrah Alar . Per doctor patient seen, patient maybe has an infection . Please Advise if patient should get something over the counter or will Jeri Lager' sullivan prescribed a medication.

## 2020-10-26 NOTE — Telephone Encounter (Signed)
Ok with me 

## 2020-10-26 NOTE — Progress Notes (Signed)
Subjective:    Patient ID: Zachary Bard., male    DOB: July 06, 1951, 69 y.o.   MRN: [id number]  HPI  Patient is a 69 yr old male who presents today with chief complaint of left "eye ball pain."  Reports that pain began yesterday.  Did not improve with aspirin.  Occasionally pain will be above the left eye. Denies nasal drainage.  Denies fever.  Denies any changes in the vision on the left. Reports hx of cataract surgery 1 month ago in his right eye.  His left eye is scheduled for next month for cataract removal.  He sees Dr. Valetta Close at [facility] ophthalmology.   He also wishes to discuss a long standing history of light headedness with standing. This has become more frequent to the point that he is afraid to do certain activities like riding his motorcycle. He has seen cardiology and had a work up which included a 2D echo (showed grade 1 diastolic dysfunction with mild MR). Also had a cardiac CT which revealed distal LAD disease felt probably of no clinical significance.  Also had Zio Monitor.  SR/SB,  Occasional PACs/PVCs,  Short runs of SVT.  Review of Systems See HPI  Past Medical History:  Diagnosis Date  . GERD (gastroesophageal reflux disease)   . Hepatitis   . Overactive bladder      Social History   Socioeconomic History  . Marital status: Divorced    Spouse name: Not on file  . Number of children: 2  . Years of education: Not on file  . Highest education level: Not on file  Occupational History  . Occupation: realtor  Tobacco Use  . Smoking status: Former Smoker    Types: Cigarettes    Quit date: 12/11/1975    Years since quitting: 44.9  . Smokeless tobacco: Never Used  Vaping Use  . Vaping Use: Never used  Substance and Sexual Activity  . Alcohol use: Yes    Comment: social  . Drug use: No  . Sexual activity: Yes  Other Topics Concern  . Not on file  Social History Narrative   Lives alone.     Social Determinants of Health   Financial Resource Strain: Low Risk     . Difficulty of Paying Living Expenses: Not hard at all  Food Insecurity: No Food Insecurity  . Worried About Charity fundraiser in the Last Year: Never true  . Ran Out of Food in the Last Year: Never true  Transportation Needs: No Transportation Needs  . Lack of Transportation (Medical): No  . Lack of Transportation (Non-Medical): No  Physical Activity:   . Days of Exercise per Week: Not on file  . Minutes of Exercise per Session: Not on file  Stress:   . Feeling of Stress : Not on file  Social Connections:   . Frequency of Communication with Friends and Family: Not on file  . Frequency of Social Gatherings with Friends and Family: Not on file  . Attends Religious Services: Not on file  . Active Member of Clubs or Organizations: Not on file  . Attends Archivist Meetings: Not on file  . Marital Status: Not on file  Intimate Partner Violence:   . Fear of Current or Ex-Partner: Not on file  . Emotionally Abused: Not on file  . Physically Abused: Not on file  . Sexually Abused: Not on file    Past Surgical History:  Procedure Laterality Date  . CHOLECYSTECTOMY  1992  .  TONSILLECTOMY      Family History  Problem Relation Age of Onset  . CAD Father 26       CABG/Stent  . Heart disease Father        CAD with CABG and stent  . Ovarian cancer Sister   . Alzheimer's disease Mother   . Heart disease Paternal Uncle   . Cancer Paternal Uncle        prostate cancer    No Known Allergies  Current Outpatient Medications on File Prior to Visit  Medication Sig Dispense Refill  . aspirin EC 325 MG tablet Take 325 mg by mouth as needed for mild pain.    . B Complex Vitamins (VITAMIN-B COMPLEX PO) Take by mouth.    . chlorhexidine (HIBICLENS) 4 % external liquid 2-3times a week x 2weeks, then once a week as needed 989 mL 0  . FOLIC ACID PO Take by mouth.    . nitroGLYCERIN (NITROSTAT) 0.4 MG SL tablet Place 1 tablet (0.4 mg total) under the tongue every 5 (five) minutes  as needed for chest pain. Do not use more than 4tabs 20 tablet 0  . Omega-3 Fatty Acids (OMEGA 3 PO) Take 1 tablet by mouth daily.    Marland Kitchen OVER THE COUNTER MEDICATION Aerie's- otc for eyes.    . tamsulosin (FLOMAX) 0.4 MG CAPS capsule TAKE 1 CAPSULE BY MOUTH EVERY DAY 30 MINUTES BEFORE THE SAME MEAL  3  . atorvastatin (LIPITOR) 20 MG tablet Take 1 tablet (20 mg total) by mouth daily. 90 tablet 3   No current facility-administered medications on file prior to visit.    BP 118/70 (BP Location: Right Arm, Patient Position: Sitting, Cuff Size: Small)   Pulse 67   Temp 98.1 F (36.7 C) (Oral)   Resp 16   Ht 6' (1.829 m)   Wt 179 lb (81.2 kg)   SpO2 100%   BMI 24.28 kg/m       Objective:   Physical Exam Constitutional:      General: He is not in acute distress.    Appearance: He is well-developed.  HENT:     Head: Normocephalic and atraumatic.     Nose:     Right Sinus: No maxillary sinus tenderness or frontal sinus tenderness.     Left Sinus: No maxillary sinus tenderness or frontal sinus tenderness.  Eyes:     General: Lids are normal.        Right eye: No foreign body.        Left eye: No foreign body, discharge or hordeolum.     Extraocular Movements: Extraocular movements intact.     Conjunctiva/sclera: Conjunctivae normal.     Comments: No erythema surrounding the left eye, no rash or lesions  Attempted fundoscopic exam with limited view of posterior eye due to constricted pupil in bright exam room.   Cardiovascular:     Rate and Rhythm: Normal rate and regular rhythm.     Heart sounds: No murmur heard.   Pulmonary:     Effort: Pulmonary effort is normal. No respiratory distress.     Breath sounds: Normal breath sounds. No wheezing or rales.  Skin:    General: Skin is warm and dry.  Neurological:     Mental Status: He is alert and oriented to person, place, and time.  Psychiatric:        Behavior: Behavior normal.        Thought Content: Thought content normal.  Assessment & Plan:  Encouraged pt to get flu and covid vaccines- he declines.   Left eye pain- recommended that he see his ophthalmologist this afternoon.  I did get him an appointment at [facility] ophthalmology at 2:15 this afternoon.  I want to make sure that his eye pressure and fundoscopic exam are OK.  No clinical sign of cellulitis, sinusitis.  I advised him to let me know if he develops a blistering rash as this could be a prodrome to herpes zoster.  Orthostatic hypotension- sbp dropped from 117/69 laying  to 90/63 standing. I did discuss with him that flomax can contribute to orthostatic hypotension and advised him to reach out to his urologist to see if they would consider changing his flomax. He notes that his symptoms preceded initiation of flomax but have gotten worse.  Discussed use of compression stockings, but he declines at this time. Other recommendations include liberalization of salt, increased hydration and standing slowly.   45 minutes spent on today's visit. Time was spent examining patient, coordinating care, reviewing chart and formulating medical plan.   This visit occurred during the SARS-CoV-2 public health emergency.  Safety protocols were in place, including screening questions prior to the visit, additional usage of staff PPE, and extensive cleaning of exam room while observing appropriate contact time as indicated for disinfecting solutions.

## 2020-10-26 NOTE — Patient Instructions (Addendum)
Please go to [facility] ophthalmology for an appointment today at 2:15 PM. Let us know immediately if you develop swelling/redness or rash in or around your left eye or if you develop changes in the vision of your left eye.

## 2020-10-26 NOTE — Telephone Encounter (Signed)
Patient is requesting transition of care from Wilfred Lacy NP, to Debbrah Alar NP   Please Advise

## 2020-10-27 ENCOUNTER — Telehealth: Payer: Self-pay

## 2020-10-27 NOTE — Telephone Encounter (Signed)
Pt called stating he saw Zachary Lawrence yesterday for eye pain. Pt states he went to eye doctor like [name] advised. Pt states the eye doctor did not find anything abnormal with his eye that was causing the pain. Pt states the eye doctor doesn't think his pain is related to his eye. Pt asked what his next steps should be due to still having the pain and if [name] would prescribe something for him. Pt also asked if [name] thought it may be a sinus infection. Please advise.

## 2020-10-27 NOTE — Telephone Encounter (Signed)
Previous message sent to [employer] with this information

## 2020-10-27 NOTE — Telephone Encounter (Signed)
Patient was advised per eye Dr. After examination she did not think the pain was from the eye itself and more like a synus or  nasal problem . He is feeling a little better today after starting some otc "synus medication" Wanted to know if provider had any other suggestions or if she wanted to order some antibiotics. Patient having family coming over next week and will like to be pain free.

## 2020-10-28 MED ORDER — AMOXICILLIN-POT CLAVULANATE 875-125 MG PO TABS
1.0000 | ORAL_TABLET | Freq: Two times a day (BID) | ORAL | 0 refills | Status: DC
Start: 2020-10-28 — End: 2020-11-21

## 2020-10-28 NOTE — Telephone Encounter (Signed)
Patient advised of new prescription and to call if not better next week.

## 2020-10-28 NOTE — Telephone Encounter (Signed)
Rx sent to [facility] for augmentin to treat sinus infection. Please let us know if his symptoms fail to improve.

## 2020-11-07 DIAGNOSIS — Z85828 Personal history of other malignant neoplasm of skin: Secondary | ICD-10-CM | POA: Diagnosis not present

## 2020-11-07 DIAGNOSIS — I781 Nevus, non-neoplastic: Secondary | ICD-10-CM | POA: Diagnosis not present

## 2020-11-07 DIAGNOSIS — L821 Other seborrheic keratosis: Secondary | ICD-10-CM | POA: Diagnosis not present

## 2020-11-07 DIAGNOSIS — L570 Actinic keratosis: Secondary | ICD-10-CM | POA: Diagnosis not present

## 2020-11-09 ENCOUNTER — Other Ambulatory Visit: Payer: Self-pay | Admitting: *Deleted

## 2020-11-09 DIAGNOSIS — E785 Hyperlipidemia, unspecified: Secondary | ICD-10-CM

## 2020-11-14 DIAGNOSIS — E785 Hyperlipidemia, unspecified: Secondary | ICD-10-CM | POA: Diagnosis not present

## 2020-11-15 LAB — LIPID PANEL
Chol/HDL Ratio: 2.8 ratio (ref 0.0–5.0)
Cholesterol, Total: 196 mg/dL (ref 100–199)
HDL: 69 mg/dL (ref >39)
LDL Chol Calc (NIH): 117 mg/dL — ABNORMAL HIGH (ref 0–99)
Triglycerides: 55 mg/dL (ref 0–149)
VLDL Cholesterol Cal: 10 mg/dL (ref 5–40)

## 2020-11-15 LAB — HEPATIC FUNCTION PANEL
ALT: 12 IU/L (ref 0–44)
AST: 19 IU/L (ref 0–40)
Albumin: 4.2 g/dL (ref 3.8–4.8)
Alkaline Phosphatase: 61 IU/L (ref 44–121)
Bilirubin Total: 1.6 mg/dL — ABNORMAL HIGH (ref 0.0–1.2)
Bilirubin, Direct: 0.36 mg/dL (ref 0.00–0.40)
Total Protein: 6.2 g/dL (ref 6.0–8.5)

## 2020-11-16 NOTE — Progress Notes (Signed)
Pt has an appointment to see Dr. Debara Pickett on 11/18/20.  Will continue to follow.

## 2020-11-18 ENCOUNTER — Encounter: Payer: Self-pay | Admitting: Internal Medicine

## 2020-11-18 ENCOUNTER — Ambulatory Visit (INDEPENDENT_AMBULATORY_CARE_PROVIDER_SITE_OTHER): Payer: Medicare Other | Admitting: Internal Medicine

## 2020-11-18 ENCOUNTER — Ambulatory Visit: Payer: Medicare Other | Admitting: Internal Medicine

## 2020-11-18 ENCOUNTER — Other Ambulatory Visit: Payer: Self-pay

## 2020-11-18 VITALS — BP 76/46 | HR 82 | Ht 72.0 in | Wt 181.2 lb

## 2020-11-18 DIAGNOSIS — R42 Dizziness and giddiness: Secondary | ICD-10-CM

## 2020-11-18 DIAGNOSIS — E782 Mixed hyperlipidemia: Secondary | ICD-10-CM

## 2020-11-18 DIAGNOSIS — N138 Other obstructive and reflux uropathy: Secondary | ICD-10-CM | POA: Diagnosis not present

## 2020-11-18 DIAGNOSIS — N401 Enlarged prostate with lower urinary tract symptoms: Secondary | ICD-10-CM | POA: Diagnosis not present

## 2020-11-18 DIAGNOSIS — T466X5A Adverse effect of antihyperlipidemic and antiarteriosclerotic drugs, initial encounter: Secondary | ICD-10-CM

## 2020-11-18 DIAGNOSIS — M791 Myalgia, unspecified site: Secondary | ICD-10-CM

## 2020-11-18 DIAGNOSIS — Z01812 Encounter for preprocedural laboratory examination: Secondary | ICD-10-CM | POA: Diagnosis not present

## 2020-11-18 DIAGNOSIS — Z20822 Contact with and (suspected) exposure to covid-19: Secondary | ICD-10-CM | POA: Diagnosis not present

## 2020-11-18 DIAGNOSIS — I951 Orthostatic hypotension: Secondary | ICD-10-CM

## 2020-11-18 NOTE — Patient Instructions (Signed)
Medication Instructions:  Your physician recommends that you continue on your current medications as directed. Please refer to the Current Medication list given to you today.  *If you need a refill on your cardiac medications before your next appointment, please call your pharmacy*   Follow-Up: At [facility], you and your health needs are our priority.  As part of our continuing mission to provide you with exceptional heart care, we have created designated Provider Care Teams.  These Care Teams include your primary Cardiologist (physician) and Advanced Practice Providers (APPs -  Physician Assistants and Nurse Practitioners) who all work together to provide you with the care you need, when you need it.  We recommend signing up for the patient portal called "MyChart".  Sign up information is provided on this After Visit Summary.  MyChart is used to connect with patients for Virtual Visits (Telemedicine).  Patients are able to view lab/test results, encounter notes, upcoming appointments, etc.  Non-urgent messages can be sent to your provider as well.   To learn more about what you can do with MyChart, go to [email or web address].    Your next appointment:    You may see Dr. Debara Pickett in the [facility] when needed. If you need an appointment please give our office a call.

## 2020-11-18 NOTE — Progress Notes (Signed)
LIPID CLINIC CONSULT NOTE  Chief Complaint:  Statin myalgia  Primary Care Physician: Debbrah Alar, NP  Primary Cardiologist:  No primary care provider on file.  HPI:  Zachary Lawrence. is a 69 y.o. male who is being seen today for the evaluation of statin myalgia at the request of Lorretta Harp, MD.  This is a pleasant 69 year old male kindly referred by Dr. Alvester Lawrence for evaluation management of dyslipidemia and recent myalgia due to statin.  Zachary Lawrence has been seen here for a period of time but has struggled with symptoms of dizziness and more recently had findings suggestive of orthostatic hypotension.  He also has a diagnosis of overactive bladder and prostate enlargement and has been on tamsulosin for some time.  More recently blood pressures have noted to been low at 100/60 or 90/63.  He says this is more typical for him but today in the office his blood pressure was 76/46.  He did note to be very fatigued, dizzy and did not feel well.  He has been working with the urologist in [facility] who plans to do a UroLift procedure next week.  He hopes that at that point maybe he would come off tamsulosin.  I told him I had concerns that the tamsulosin would be causing his hypotension and orthostasis and he says he had never been told that this could have been related to the medicine.  Also with his hypotension, I wonder if he possibly could have a cortisol deficiency that might explain the low blood pressures.  He is not otherwise on any medicines to lower blood pressure.  With regards to the statin, he was placed on atorvastatin for a fit profile which showed total cholesterol 196, triglycerides 55, HDL 69 and LDL 117.  More recently he had a CT coronary angiogram, which demonstrated minimal coronary disease and a calcium score of 6, 38th percentile for age and sex matched control with mild atherosclerosis of the distal LAD which was likely flow-limiting but felt to be a small distal  vessel.  Unfortunately, the atorvastatin caused significant myalgias leading to discontinuation of the medication.  PMHx:  Past Medical History:  Diagnosis Date  . GERD (gastroesophageal reflux disease)   . Hepatitis   . Overactive bladder     Past Surgical History:  Procedure Laterality Date  . CHOLECYSTECTOMY  1992  . TONSILLECTOMY      FAMHx:  Family History  Problem Relation Age of Onset  . CAD Father 50       CABG/Stent  . Heart disease Father        CAD with CABG and stent  . Ovarian cancer Sister   . Alzheimer's disease Mother   . Heart disease Paternal Uncle   . Cancer Paternal Uncle        prostate cancer    SOCHx:   reports that he quit smoking about 44 years ago. His smoking use included cigarettes. He has never used smokeless tobacco. He reports current alcohol use. He reports that he does not use drugs.  ALLERGIES:  Allergies  Allergen Reactions  . Atorvastatin Other (See Comments)    Muscle and joint pain.    ROS: Pertinent items noted in HPI and remainder of comprehensive ROS otherwise negative.  HOME MEDS: Current Outpatient Medications on File Prior to Visit  Medication Sig Dispense Refill  . amoxicillin-clavulanate (AUGMENTIN) 875-125 MG tablet Take 1 tablet by mouth 2 (two) times daily. 20 tablet 0  . aspirin  EC 325 MG tablet Take 325 mg by mouth as needed for mild pain.    . B Complex Vitamins (VITAMIN-B COMPLEX PO) Take by mouth.    . chlorhexidine (HIBICLENS) 4 % external liquid 2-3times a week x 2weeks, then once a week as needed 120 mL 0  . ciprofloxacin (CIPRO) 500 MG tablet Take 500 mg by mouth 2 (two) times daily.    Marland Kitchen FOLIC ACID PO Take by mouth.    . nitroGLYCERIN (NITROSTAT) 0.4 MG SL tablet Place 1 tablet (0.4 mg total) under the tongue every 5 (five) minutes as needed for chest pain. Do not use more than 4tabs 20 tablet 0  . Omega-3 Fatty Acids (OMEGA 3 PO) Take 1 tablet by mouth daily.    Marland Kitchen OVER THE COUNTER MEDICATION Aerie's- otc  for eyes.    . tamsulosin (FLOMAX) 0.4 MG CAPS capsule TAKE 1 CAPSULE BY MOUTH EVERY DAY 30 MINUTES BEFORE THE SAME MEAL  3   No current facility-administered medications on file prior to visit.    LABS/IMAGING: No results found for this or any previous visit (from the past 48 hour(s)). No results found.  LIPID PANEL:    Component Value Date/Time   CHOL 196 11/14/2020 0851   TRIG 55 11/14/2020 0851   HDL 69 11/14/2020 0851   CHOLHDL 2.8 11/14/2020 0851   CHOLHDL 3 04/28/2020 1349   VLDL 9.8 04/28/2020 1349   LDLCALC 117 (H) 11/14/2020 0851    WEIGHTS: Wt Readings from Last 3 Encounters:  11/18/20 181 lb 3.2 oz (82.2 kg)  10/26/20 179 lb (81.2 kg)  10/11/20 181 lb (82.1 kg)    VITALS: BP (!) 76/46   Pulse 82   Ht 6' (1.829 m)   Wt 181 lb 3.2 oz (82.2 kg)   BMI 24.58 kg/m   EXAM: General appearance: alert and no distress Neck: no carotid bruit, no JVD and thyroid not enlarged, symmetric, no tenderness/mass/nodules Lungs: clear to auscultation bilaterally Heart: regular rate and rhythm Abdomen: soft, non-tender; bowel sounds normal; no masses,  no organomegaly Extremities: extremities normal, atraumatic, no cyanosis or edema Pulses: 2+ and symmetric Skin: Skin color, texture, turgor normal. No rashes or lesions Neurologic: Grossly normal Psych: Pleasant  EKG: Deferred  ASSESSMENT: 1. Mixed dyslipidemia, goal LDL less than 100 2. Minimal coronary artery disease with a calcium score of 60, 38th percentile for age and sex-matched control 3. Orthostatic hypotension 4. BPH 5. Statin intolerant- myalgias  PLAN: 1.   Zachary Lawrence has a mixed dyslipidemia for which she was placed on atorvastatin but had significant myalgias.  He actually has a very low calcium score and very minimal coronary disease for his age.  I think he can continue to work on diet and exercise and may not even need statin therapy or very low dose.  We could also consider an alternative such as  ezetimibe.  More importantly was a very low blood pressure today.  He continues to struggle with dizziness and more recently orthostatic hypotension.  I suspect tamsulosin could be contributing to this.  He is scheduled to have a UroLift procedure and hopefully his urologist would discontinue the tamsulosin.  He had not mentioned to him that this could be causing orthostatic hypotension.  Ultimately if it is removed and his symptoms did not resolve.  His PCP should consider testing for adrenal insufficiency or other causes of hypotension.  Thanks for the kind referral.  Pixie Casino, MD, FACC, [facility]  Medical Director of the [facility] Diplomate of the American Board of Clinical Lipidology Attending Cardiologist  Direct Dial: [phone]  Fax: [phone]  Website:  [email or web address] 11/18/2020, 1:44 PM

## 2020-11-21 ENCOUNTER — Encounter: Payer: Self-pay | Admitting: Family

## 2020-11-21 ENCOUNTER — Ambulatory Visit (INDEPENDENT_AMBULATORY_CARE_PROVIDER_SITE_OTHER): Payer: Medicare Other | Admitting: Family

## 2020-11-21 ENCOUNTER — Other Ambulatory Visit: Payer: Self-pay

## 2020-11-21 VITALS — BP 102/56 | HR 77 | Temp 98.4°F | Resp 16 | Ht 72.0 in | Wt 180.0 lb

## 2020-11-21 DIAGNOSIS — Z8619 Personal history of other infectious and parasitic diseases: Secondary | ICD-10-CM

## 2020-11-21 DIAGNOSIS — E782 Mixed hyperlipidemia: Secondary | ICD-10-CM

## 2020-11-21 DIAGNOSIS — I951 Orthostatic hypotension: Secondary | ICD-10-CM | POA: Diagnosis not present

## 2020-11-21 DIAGNOSIS — J019 Acute sinusitis, unspecified: Secondary | ICD-10-CM

## 2020-11-21 DIAGNOSIS — N401 Enlarged prostate with lower urinary tract symptoms: Secondary | ICD-10-CM

## 2020-11-21 DIAGNOSIS — K219 Gastro-esophageal reflux disease without esophagitis: Secondary | ICD-10-CM | POA: Diagnosis not present

## 2020-11-21 NOTE — Progress Notes (Signed)
Subjective:    Patient ID: Zachary Bard., male    DOB: [birth year], 69 y.o.   MRN: [id number]  HPI  Patient is a 69 yr old male who presents today for follow up.  Last visit he complained about pain around his left eye.  We had him follow up with ophthalmology and his exam was reportedly normal and ophthalmology thought that his pain was more likely sinus related.  We prescribed a course of augmentin.    Hx of BPH-He is scheduled for a urolift surgery on 11/23/20.   Orthostatic hypotension- He reports that he had a typical dizzy spell when he went to the lipid clinic and saw Dr. Debara Pickett. States that his SBP was in the 70's. Dr. Debara Pickett also thought that flomax might be contributing to his orthostatic hypotension.   Hx of Hepatitis C- Dr.  Wallis Mart.  He had hep C in the early 90's.  States that he completed the treatment Riboviran for 12 months.  States that he was tested annual x 5 years after and was undetectable.   GERD- uses tums prn.    Hyperlipidemia-   Lab Results  Component Value Date   CHOL 196 11/14/2020   HDL 69 11/14/2020   LDLCALC 117 (H) 11/14/2020   TRIG 55 11/14/2020   CHOLHDL 2.8 11/14/2020    Review of Systems See HPI  Past Medical History:  Diagnosis Date   GERD (gastroesophageal reflux disease)    Hepatitis    Overactive bladder      Social History   Socioeconomic History   Marital status: Divorced    Spouse name: Not on file   Number of children: 2   Years of education: Not on file   Highest education level: Not on file  Occupational History   Occupation: realtor  Tobacco Use   Smoking status: Former Smoker    Types: Cigarettes    Quit date: 12/11/1975    Years since quitting: 44.9   Smokeless tobacco: Never Used  Vaping Use   Vaping Use: Never used  Substance and Sexual Activity   Alcohol use: Yes    Comment: social   Drug use: No   Sexual activity: Yes  Other Topics Concern   Not on file  Social History Narrative    Lives alone.     Social Determinants of Health   Financial Resource Strain: Low Risk    Difficulty of Paying Living Expenses: Not hard at all  Food Insecurity: No Food Insecurity   Worried About Charity fundraiser in the Last Year: Never true   [place] in the Last Year: Never true  Transportation Needs: No Transportation Needs   Lack of Transportation (Medical): No   Lack of Transportation (Non-Medical): No  Physical Activity: Not on file  Stress: Not on file  Social Connections: Not on file  Intimate Partner Violence: Not on file    Past Surgical History:  Procedure Laterality Date   CHOLECYSTECTOMY  1992   TONSILLECTOMY      Family History  Problem Relation Age of Onset   CAD Father 55       CABG/Stent   Heart disease Father        CAD with CABG and stent   Ovarian cancer Sister    Alzheimer's disease Mother    Heart disease Paternal Uncle    Cancer Paternal Uncle        prostate cancer    Allergies  Allergen Reactions  Atorvastatin Other (See Comments)    Muscle and joint pain.    Current Outpatient Medications on File Prior to Visit  Medication Sig Dispense Refill   aspirin EC 325 MG tablet Take 325 mg by mouth as needed for mild pain.     B Complex Vitamins (VITAMIN-B COMPLEX PO) Take by mouth.     chlorhexidine (HIBICLENS) 4 % external liquid 2-3times a week x 2weeks, then once a week as needed 120 mL 0   ciprofloxacin (CIPRO) 500 MG tablet Take 500 mg by mouth 2 (two) times daily.     FOLIC ACID PO Take by mouth.     nitroGLYCERIN (NITROSTAT) 0.4 MG SL tablet Place 1 tablet (0.4 mg total) under the tongue every 5 (five) minutes as needed for chest pain. Do not use more than 4tabs 20 tablet 0   Omega-3 Fatty Acids (OMEGA 3 PO) Take 1 tablet by mouth daily.     OVER THE COUNTER MEDICATION Aerie's- otc for eyes.     tamsulosin (FLOMAX) 0.4 MG CAPS capsule TAKE 1 CAPSULE BY MOUTH EVERY DAY 30 MINUTES BEFORE THE SAME MEAL  3    No current facility-administered medications on file prior to visit.    BP (!) 102/56 (BP Location: Right Arm, Patient Position: Sitting, Cuff Size: Small)    Pulse 77    Temp 98.4 F (36.9 C) (Oral)    Resp 16    Ht 6' (1.829 m)    Wt 180 lb (81.6 kg)    SpO2 100%    BMI 24.41 kg/m       Objective:   Physical Exam Constitutional:      General: He is not in acute distress.    Appearance: He is well-developed and well-nourished.  HENT:     Head: Normocephalic and atraumatic.  Cardiovascular:     Rate and Rhythm: Normal rate and regular rhythm.     Heart sounds: No murmur heard.   Pulmonary:     Effort: Pulmonary effort is normal. No respiratory distress.     Breath sounds: Normal breath sounds. No wheezing or rales.  Musculoskeletal:        General: No edema.  Skin:    General: Skin is warm and dry.  Neurological:     Mental Status: He is alert and oriented to person, place, and time.  Psychiatric:        Mood and Affect: Mood and affect normal.        Behavior: Behavior normal.        Thought Content: Thought content normal.           Assessment & Plan:  Sinusitis- clinically resolved. Monitor.  Hx of BPH- hopefully flomax can be d/c'd after he completes the Urolift procedure.   Orthostatic hypotension- will see how he does following discontinuation of flomax. If this does not improve his symptoms, will plan evaluation for adrenal insufficiency.  GERD- stable with prn use of tums.  Monitor.   Hyperlipidemia- intolerant to statins. Went to the lipid clinic and was advised on diet/exercise.   History of hepatitis C- states that he completed curative therapy in the 90's and had 5 years of follow up negative testing.    This visit occurred during the SARS-CoV-2 public health emergency.  Safety protocols were in place, including screening questions prior to the visit, additional usage of staff PPE, and extensive cleaning of exam room while observing appropriate  contact time as indicated for disinfecting solutions.

## 2020-11-23 DIAGNOSIS — N401 Enlarged prostate with lower urinary tract symptoms: Secondary | ICD-10-CM | POA: Diagnosis not present

## 2020-11-23 DIAGNOSIS — N138 Other obstructive and reflux uropathy: Secondary | ICD-10-CM | POA: Diagnosis not present

## 2020-12-06 DIAGNOSIS — N401 Enlarged prostate with lower urinary tract symptoms: Secondary | ICD-10-CM | POA: Diagnosis not present

## 2020-12-06 DIAGNOSIS — N138 Other obstructive and reflux uropathy: Secondary | ICD-10-CM | POA: Diagnosis not present

## 2021-01-17 DIAGNOSIS — N138 Other obstructive and reflux uropathy: Secondary | ICD-10-CM | POA: Diagnosis not present

## 2021-01-17 DIAGNOSIS — N401 Enlarged prostate with lower urinary tract symptoms: Secondary | ICD-10-CM | POA: Diagnosis not present

## 2021-01-18 DIAGNOSIS — H2512 Age-related nuclear cataract, left eye: Secondary | ICD-10-CM | POA: Diagnosis not present

## 2021-01-18 DIAGNOSIS — H25812 Combined forms of age-related cataract, left eye: Secondary | ICD-10-CM | POA: Diagnosis not present

## 2021-02-07 ENCOUNTER — Ambulatory Visit (INDEPENDENT_AMBULATORY_CARE_PROVIDER_SITE_OTHER): Payer: Medicare Other | Admitting: Family

## 2021-02-07 ENCOUNTER — Encounter: Payer: Self-pay | Admitting: Family

## 2021-02-07 ENCOUNTER — Other Ambulatory Visit: Payer: Self-pay

## 2021-02-07 VITALS — BP 121/73 | HR 57 | Temp 98.7°F | Resp 16 | Ht 72.0 in | Wt 179.0 lb

## 2021-02-07 DIAGNOSIS — B0229 Other postherpetic nervous system involvement: Secondary | ICD-10-CM | POA: Diagnosis not present

## 2021-02-07 DIAGNOSIS — N400 Enlarged prostate without lower urinary tract symptoms: Secondary | ICD-10-CM

## 2021-02-07 DIAGNOSIS — I951 Orthostatic hypotension: Secondary | ICD-10-CM | POA: Diagnosis not present

## 2021-02-07 NOTE — Progress Notes (Signed)
Subjective:    Patient ID: Zachary Bard., male    DOB: [birth year], 70 y.o.   MRN: [id number]  HPI  Patient is a 70 yr old male who presents today for follow up. Reports that he still "gets some dizzy spells."  They are less frequent. Reports that it occurs several times a week- lasts a minute or two.    BP Readings from Last 3 Encounters:  02/07/21 121/73  11/21/20 (!) 102/56  11/18/20 (!) 76/46   BPH- he had urolift surgery which was very successful.  He had to take flomax for 1 week following surgery.    C/o pain at the base of both thumbs.  R>L  Reports that he had shingles about a month ago and since that time he has had burning pain in that location (right lower back).  Review of Systems    see HPI  Past Medical History:  Diagnosis Date  . GERD (gastroesophageal reflux disease)   . History of hepatitis C    s/p curative therapy in the 90's.    . Overactive bladder      Social History   Socioeconomic History  . Marital status: Divorced    Spouse name: Not on file  . Number of children: 2  . Years of education: Not on file  . Highest education level: Not on file  Occupational History  . Occupation: realtor  Tobacco Use  . Smoking status: Former Smoker    Types: Cigarettes    Quit date: 12/11/1975    Years since quitting: 45.1  . Smokeless tobacco: Never Used  Vaping Use  . Vaping Use: Never used  Substance and Sexual Activity  . Alcohol use: Yes    Comment: social  . Drug use: No  . Sexual activity: Yes  Other Topics Concern  . Not on file  Social History Narrative   Lives alone.     Social Determinants of Health   Financial Resource Strain: Low Risk   . Difficulty of Paying Living Expenses: Not hard at all  Food Insecurity: No Food Insecurity  . Worried About Charity fundraiser in the Last Year: Never true  . Ran Out of Food in the Last Year: Never true  Transportation Needs: No Transportation Needs  . Lack of Transportation (Medical): No   . Lack of Transportation (Non-Medical): No  Physical Activity: Not on file  Stress: Not on file  Social Connections: Not on file  Intimate Partner Violence: Not on file    Past Surgical History:  Procedure Laterality Date  . CHOLECYSTECTOMY  1992  . TONSILLECTOMY      Family History  Problem Relation Age of Onset  . CAD Father 71       CABG/Stent  . Heart disease Father        CAD with CABG and stent  . Ovarian cancer Sister   . Alzheimer's disease Mother   . Heart disease Paternal Uncle   . Cancer Paternal Uncle        prostate cancer    Allergies  Allergen Reactions  . Atorvastatin Other (See Comments)    Muscle and joint pain.    Current Outpatient Medications on File Prior to Visit  Medication Sig Dispense Refill  . aspirin EC 325 MG tablet Take 325 mg by mouth as needed for mild pain.    . B Complex Vitamins (VITAMIN-B COMPLEX PO) Take by mouth.    . chlorhexidine (HIBICLENS) 4 % external liquid 2-3times  a week x 2weeks, then once a week as needed 120 mL 0  . ciprofloxacin (CIPRO) 500 MG tablet Take 500 mg by mouth 2 (two) times daily.    Marland Kitchen FOLIC ACID PO Take by mouth.    . nitroGLYCERIN (NITROSTAT) 0.4 MG SL tablet Place 1 tablet (0.4 mg total) under the tongue every 5 (five) minutes as needed for chest pain. Do not use more than 4tabs 20 tablet 0  . Omega-3 Fatty Acids (OMEGA 3 PO) Take 1 tablet by mouth daily.    Marland Kitchen OVER THE COUNTER MEDICATION Aerie's- otc for eyes.    . tamsulosin (FLOMAX) 0.4 MG CAPS capsule TAKE 1 CAPSULE BY MOUTH EVERY DAY 30 MINUTES BEFORE THE SAME MEAL  3   No current facility-administered medications on file prior to visit.    BP 121/73 (BP Location: Right Arm, Patient Position: Sitting, Cuff Size: Small)   Pulse (!) 57   Temp 98.7 F (37.1 C) (Oral)   Resp 16   Ht 6' (1.829 m)   Wt 179 lb (81.2 kg)   SpO2 100%   BMI 24.28 kg/m    Objective:   Physical Exam Constitutional:      General: He is not in acute distress.     Appearance: He is well-developed and well-nourished.  HENT:     Head: Normocephalic and atraumatic.  Cardiovascular:     Rate and Rhythm: Normal rate and regular rhythm.     Heart sounds: No murmur heard.   Pulmonary:     Effort: Pulmonary effort is normal. No respiratory distress.     Breath sounds: Normal breath sounds. No wheezing or rales.  Musculoskeletal:        General: No edema.  Skin:    General: Skin is warm and dry.     Comments: No blisters or breakdown of skin noted on lower back  Neurological:     Mental Status: He is alert and oriented to person, place, and time.  Psychiatric:        Mood and Affect: Mood and affect normal.        Behavior: Behavior normal.        Thought Content: Thought content normal.           Assessment & Plan:  Orthostatic hypotension- pt has had negative cardiac work up and only modest improvement in his symptoms following discontinuation of flomax.  I am concerned about the possibility of adrenal insufficiency.  BPH- stable following urolift.  Management per Urology.  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  Recommended trial of voltaren gel to affected area bid.  Post-herpetic neuralgia- new. recommended trial of capsaicin. He will let me know if this does not help and we can try trial of gabapentin.  This visit occurred during the SARS-CoV-2 public health emergency.  Safety protocols were in place, including screening questions prior to the visit, additional usage of staff PPE, and extensive cleaning of exam room while observing appropriate contact time as indicated for disinfecting solutions.

## 2021-02-07 NOTE — Patient Instructions (Signed)
You can try Capsaicin cream as needed to shingles affected area.   Voltaren gel twice daily to both thumbs.

## 2021-02-11 ENCOUNTER — Telehealth: Payer: Self-pay | Admitting: Family

## 2021-02-11 DIAGNOSIS — I951 Orthostatic hypotension: Secondary | ICD-10-CM

## 2021-02-11 NOTE — Telephone Encounter (Signed)
-----   Message from Cloyd Stagers, MD sent at 02/09/2021 12:51 PM EST ----- That's absolutely fine but I would recommend I see him in [place] office, that way we can do the test on him here.   Thank you for your referrals   Abby ----- Message ----- From: Debbrah Alar, NP Sent: 02/09/2021  12:35 PM EST To: Cloyd Stagers, MD  Dear Dr. Kelton Pillar,  This gentleman has a longstanding hx of orthostatic hypotension. He has had a negative cardiac work up and most recently was able to come of of flomax with only modest improvement in his symptoms.  I am concerned about the possibility of adrenal insufficiency. I would like to refer him to you for evaluation if you feel this is appropriate.  I don't have experience doing ACTH stim testing.    Please let me know.  Thanks,  [employer]

## 2021-04-03 ENCOUNTER — Ambulatory Visit (INDEPENDENT_AMBULATORY_CARE_PROVIDER_SITE_OTHER): Payer: Medicare Other | Admitting: Internal Medicine

## 2021-04-03 ENCOUNTER — Encounter: Payer: Self-pay | Admitting: Internal Medicine

## 2021-04-03 ENCOUNTER — Other Ambulatory Visit: Payer: Self-pay

## 2021-04-03 VITALS — BP 122/70 | HR 56 | Ht 72.0 in | Wt 179.4 lb

## 2021-04-03 DIAGNOSIS — I951 Orthostatic hypotension: Secondary | ICD-10-CM

## 2021-04-03 DIAGNOSIS — R42 Dizziness and giddiness: Secondary | ICD-10-CM | POA: Diagnosis not present

## 2021-04-03 NOTE — Progress Notes (Signed)
Name: Zachary Lawrence.  MRN/ DOB: [id number], 05/12/1951    Age/ Sex: 70 y.o., male    PCP: Debbrah Alar, NP   Reason for Endocrinology Evaluation: Orthostatic Hypotension     Date of Initial Endocrinology Evaluation: 04/03/2021     HPI: Mr. Simran Mannis. is a 70 y.o. male with a past medical history of Hepatitis C with hepatic fibrosis, BPH and dyslipidemia  . The patient presented for initial endocrinology clinic visit on 04/03/2021 for consultative assistance with his orthostatic hypotension .     He was noted with orthostatic hypotension during evaluation in 10/2020 with a SBP 117 mmhg supine dropping to 90 mmhg standing. He has been having dizziness since 2017, he notes that his symptoms started prior to initiation of tamsulosin, but worsened with Tamsulosin.   He follows with cardiology and been diagnosed with mild distal CAD.  The dizziness is affecting his lifestyle,worse with motion or strenuous activity . Relieved by resting . The dizziness last usually a couple of minutes.  He denies spinning sensation but has lightheadedness  His weight has been stable over the years  He has no history of hypokalemia  Denies nausea or vomiting  Denies abdominal pain, flank or back pains  Denies headaches   Has been off Tamsulosin 11/2020- no change in lightheadedness since he has been off of it     He went through hepatitis treatment for ~ 18 months back in the 1990's    HISTORY:  Past Medical History:  Past Medical History:  Diagnosis Date  . GERD (gastroesophageal reflux disease)   . History of hepatitis C    s/p curative therapy in the 90's.    . Overactive bladder    Past Surgical History:  Past Surgical History:  Procedure Laterality Date  . CHOLECYSTECTOMY  1992  . TONSILLECTOMY        Social History:  reports that he quit smoking about 45 years ago. His smoking use included cigarettes. He has never used smokeless tobacco. He reports current alcohol  use. He reports that he does not use drugs.  Family History: family history includes Alzheimer's disease in his mother; CAD (age of onset: 25) in his father; Cancer in his paternal uncle; Heart disease in his father and paternal uncle; Ovarian cancer in his sister.   HOME MEDICATIONS: Allergies as of 04/03/2021      Reactions   Atorvastatin Other (See Comments)   Muscle and joint pain.      Medication List       Accurate as of April 03, 2021 12:26 PM. If you have any questions, ask your nurse or doctor.        STOP taking these medications   tamsulosin 0.4 MG Caps capsule Commonly known as: FLOMAX Stopped by: Dorita Sciara, MD     TAKE these medications   aspirin EC 325 MG tablet Take 325 mg by mouth as needed for mild pain.   ciprofloxacin 500 MG tablet Commonly known as: CIPRO Take 500 mg by mouth 2 (two) times daily.   FOLIC ACID PO Take by mouth.   Hibiclens 4 % external liquid Generic drug: chlorhexidine 2-3times a week x 2weeks, then once a week as needed   nitroGLYCERIN 0.4 MG SL tablet Commonly known as: NITROSTAT Place 1 tablet (0.4 mg total) under the tongue every 5 (five) minutes as needed for chest pain. Do not use more than 4tabs   OMEGA 3 PO Take 1 tablet  by mouth daily.   OVER THE COUNTER MEDICATION Aerie's- otc for eyes.   VITAMIN-B COMPLEX PO Take by mouth.         REVIEW OF SYSTEMS: A comprehensive ROS was conducted with the patient and is negative except as per HPI    OBJECTIVE:  VS: BP 122/70   Pulse (!) 56   Ht 6' (1.829 m)   Wt 179 lb 6 oz (81.4 kg)   SpO2 98%   BMI 24.33 kg/m    Wt Readings from Last 3 Encounters:  04/03/21 179 lb 6 oz (81.4 kg)  02/07/21 179 lb (81.2 kg)  11/21/20 180 lb (81.6 kg)     EXAM: General: Pt appears well and is in NAD  Neck: General: Supple without adenopathy. Thyroid: Thyroid size normal.  No goiter or nodules appreciated.   Lungs: Clear with good BS bilat with no rales,  rhonchi, or wheezes  Heart: Auscultation: RRR.  Abdomen: Normoactive bowel sounds, soft, nontender, without masses or organomegaly palpable  Extremities:  BL LE: No pretibial edema normal ROM and strength.  Skin: Hair: Texture and amount normal with gender appropriate distribution Skin Inspection: No rashes Skin Palpation: Skin temperature, texture, and thickness normal to palpation  Neuro: Cranial nerves: II - XII grossly intact  Motor: Normal strength throughout DTRs: 2+ and symmetric in UE without delay in relaxation phase  Mental Status: Judgment, insight: Intact Orientation: Oriented to time, place, and person Mood and affect: No depression, anxiety, or agitation     DATA REVIEWED:   Results for JAUAN, WOHL (MRN [id number]) as of 04/03/2021 12:22  Ref. Range 05/31/2020 10:55 11/14/2020 08:51  Sodium Latest Ref Range: 134 - 144 mmol/L 138   Potassium Latest Ref Range: 3.5 - 5.2 mmol/L 5.0   Chloride Latest Ref Range: 96 - 106 mmol/L 103   CO2 Latest Ref Range: 20 - 29 mmol/L 24   Glucose Latest Ref Range: 65 - 99 mg/dL 108 (H)   BUN Latest Ref Range: 8 - 27 mg/dL 22   Creatinine Latest Ref Range: 0.76 - 1.27 mg/dL 1.22   Calcium Latest Ref Range: 8.6 - 10.2 mg/dL 9.4   BUN/Creatinine Ratio Latest Ref Range: 10 - 24  18   Alkaline Phosphatase Latest Ref Range: 44 - 121 IU/L  61  Albumin Latest Ref Range: 3.8 - 4.8 g/dL  4.2  AST Latest Ref Range: 0 - 40 IU/L  19  ALT Latest Ref Range: 0 - 44 IU/L  12  Total Protein Latest Ref Range: 6.0 - 8.5 g/dL  6.2  Total Bilirubin Latest Ref Range: 0.0 - 1.2 mg/dL  1.6 (H)  GFR, Est Non African American Latest Ref Range: >59 mL/min/1.73 60   GFR, Est African American Latest Ref Range: >59 mL/min/1.73 69   BILIRUBIN, DIRECT Latest Ref Range: 0.00 - 0.40 mg/dL  0.36  Results for CEM, KOSMAN (MRN [id number]) as of 04/03/2021 12:22  Ref. Range 04/28/2020 13:49  WBC Latest Ref Range: 4.0 - 10.5 K/uL 5.8  RBC Latest Ref Range:  4.22 - 5.81 Mil/uL 4.27  Hemoglobin Latest Ref Range: 13.0 - 17.0 g/dL 13.9  HCT Latest Ref Range: 39.0 - 52.0 % 40.1  MCV Latest Ref Range: 78.0 - 100.0 fl 93.8  MCHC Latest Ref Range: 30.0 - 36.0 g/dL 34.7  RDW Latest Ref Range: 11.5 - 15.5 % 13.1  Platelets Latest Ref Range: 150.0 - 400.0 K/uL 144.0 (L)  Neutrophils Latest Ref Range: 43.0 - 77.0 %  57.5  Lymphocytes Latest Ref Range: 12.0 - 46.0 % 28.9  Monocytes Relative Latest Ref Range: 3.0 - 12.0 % 9.3  Eosinophil Latest Ref Range: 0.0 - 5.0 % 3.1  Basophil Latest Ref Range: 0.0 - 3.0 % 1.2  NEUT# Latest Ref Range: 1.4 - 7.7 K/uL 3.3  Lymphocyte # Latest Ref Range: 0.7 - 4.0 K/uL 1.7  Monocyte # Latest Ref Range: 0.1 - 1.0 K/uL 0.5  Eosinophils Absolute Latest Ref Range: 0.0 - 0.7 K/uL 0.2  Basophils Absolute Latest Ref Range: 0.0 - 0.1 K/uL 0.1    ASSESSMENT/PLAN/RECOMMENDATIONS:   1. Orthostatic Hypotension :   - Pt with lightheadedness since 2017 , symptoms continue despite being off tamsulosin since 11/2020 - Lasting 1-2 minutes , mainly positional  - Will proceed with cosyntropin stim. Test due to concerns of adrenal insufficiency - We discussed other D/D to include anemia, dehydration, and autonomic neuropathy.  - Unable to proceed with TFt check as this is not covered by insurance   F/U PRN - pending cosyntropin stim test ( Pt will return for this )  Signed electronically by: Mack Guise, MD  [facility]  [facility] [address] [place], [place] [address] Phone: [phone] FAX: [phone]   CC: Debbrah Alar, [address] [place] [address] Phone: [phone] Fax: [phone]   Return to Endocrinology clinic as below: Future Appointments  Date Time Provider [place]  04/05/2021  9:15 AM [facility] LAB [facility] None  04/05/2021 10:00 AM [facility] NURSE [facility] None

## 2021-04-05 ENCOUNTER — Other Ambulatory Visit (INDEPENDENT_AMBULATORY_CARE_PROVIDER_SITE_OTHER): Payer: Medicare Other

## 2021-04-05 ENCOUNTER — Ambulatory Visit (INDEPENDENT_AMBULATORY_CARE_PROVIDER_SITE_OTHER): Payer: Medicare Other | Admitting: *Deleted

## 2021-04-05 ENCOUNTER — Other Ambulatory Visit: Payer: Self-pay

## 2021-04-05 DIAGNOSIS — I951 Orthostatic hypotension: Secondary | ICD-10-CM | POA: Diagnosis not present

## 2021-04-05 LAB — BASIC METABOLIC PANEL
BUN: 23 mg/dL (ref 6–23)
CO2: 26 mEq/L (ref 19–32)
Calcium: 9 mg/dL (ref 8.4–10.5)
Chloride: 106 mEq/L (ref 96–112)
Creatinine, Ser: 1.28 mg/dL (ref 0.40–1.50)
GFR: 56.81 mL/min — ABNORMAL LOW (ref >60.00)
Glucose, Bld: 94 mg/dL (ref 70–99)
Potassium: 4.1 mEq/L (ref 3.5–5.1)
Sodium: 139 mEq/L (ref 135–145)

## 2021-04-05 LAB — CORTISOL
Cortisol, Plasma: 13.7 ug/dL
Cortisol, Plasma: 21.8 ug/dL
Cortisol, Plasma: 27.1 ug/dL

## 2021-04-05 MED ORDER — COSYNTROPIN 0.25 MG IJ SOLR
0.2500 mg | Freq: Once | INTRAMUSCULAR | Status: AC
  Administered 2021-04-05: 0.25 mg via INTRAMUSCULAR

## 2021-04-05 NOTE — Progress Notes (Signed)
Per orders of Dr Shamleffer, injection of cosyntropin 0.25 mg/vial  given by Addley Ballinger, CMA.. Patient tolerated injection well.  

## 2021-04-06 ENCOUNTER — Encounter: Payer: Self-pay | Admitting: Internal Medicine

## 2021-04-25 DIAGNOSIS — N138 Other obstructive and reflux uropathy: Secondary | ICD-10-CM | POA: Diagnosis not present

## 2021-04-25 DIAGNOSIS — N401 Enlarged prostate with lower urinary tract symptoms: Secondary | ICD-10-CM | POA: Diagnosis not present

## 2021-06-14 ENCOUNTER — Telehealth: Payer: Self-pay | Admitting: Family

## 2021-06-14 ENCOUNTER — Other Ambulatory Visit: Payer: Self-pay

## 2021-06-14 ENCOUNTER — Ambulatory Visit (INDEPENDENT_AMBULATORY_CARE_PROVIDER_SITE_OTHER): Payer: Medicare Other | Admitting: Family

## 2021-06-14 VITALS — BP 103/65 | HR 62 | Temp 98.6°F | Resp 16 | Wt 180.0 lb

## 2021-06-14 DIAGNOSIS — M1990 Unspecified osteoarthritis, unspecified site: Secondary | ICD-10-CM | POA: Diagnosis not present

## 2021-06-14 DIAGNOSIS — Z23 Encounter for immunization: Secondary | ICD-10-CM | POA: Diagnosis not present

## 2021-06-14 DIAGNOSIS — I951 Orthostatic hypotension: Secondary | ICD-10-CM

## 2021-06-14 MED ORDER — SHINGRIX 50 MCG/0.5ML IM SUSR: INTRAMUSCULAR | 1 refills | Status: DC

## 2021-06-14 NOTE — Telephone Encounter (Signed)
Please call Dr. Osborn Coho office and request date and copy of most recent colo. Let me know and I will see if I need to re-order.

## 2021-06-14 NOTE — Progress Notes (Signed)
Subjective:     Patient ID: Zachary Lawrence., male    DOB: April 05, 1951, 70 y.o.   MRN: [id number]  Chief Complaint  Patient presents with   Orthostatic hypotension    Still having dizziness on and off    HPI   Patient is in today for follow up.  We last saw him back in March.  He complained of dizzy spells at that time. We referred him to Endo and they completed work up for adrenal insufficiency which was WNL.   BP Readings from Last 3 Encounters:  06/16/21 122/76  06/14/21 103/65  04/03/21 122/70  He has been off of flomax since the 15th of December.  This past weekend he had a few episodes.  Notes that he is trying to do better with his water intake  Reports resolution of shingles pain.  Health Maintenance Due  Topic Date Due   Zoster Vaccines- Shingrix (1 of 2) Never done   COLONOSCOPY (Pts 45-58yrs Insurance coverage will need to be confirmed)  01/19/2013   PNA vac Low Risk Adult (1 of 2 - PCV13) 01/02/2016    Past Medical History:  Diagnosis Date   GERD (gastroesophageal reflux disease)    History of hepatitis C    s/p curative therapy in the 90's.     Orthostatic hypotension 06/15/2021   Overactive bladder     Past Surgical History:  Procedure Laterality Date   CHOLECYSTECTOMY  1992   TONSILLECTOMY      Family History  Problem Relation Age of Onset   CAD Father 45       CABG/Stent   Heart disease Father        CAD with CABG and stent   Ovarian cancer Sister    Alzheimer's disease Mother    Heart disease Paternal Uncle    Cancer Paternal Uncle        prostate cancer    Social History   Socioeconomic History   Marital status: Divorced    Spouse name: Not on file   Number of children: 2   Years of education: Not on file   Highest education level: Not on file  Occupational History   Occupation: realtor  Tobacco Use   Smoking status: Former    Pack years: 0.00    Types: Cigarettes    Quit date: 12/11/1975    Years since quitting: 45.5    Smokeless tobacco: Never  Vaping Use   Vaping Use: Never used  Substance and Sexual Activity   Alcohol use: Yes    Comment: social   Drug use: No   Sexual activity: Yes  Other Topics Concern   Not on file  Social History Narrative   Lives alone.     Social Determinants of Health   Financial Resource Strain: Not on file  Food Insecurity: Not on file  Transportation Needs: Not on file  Physical Activity: Not on file  Stress: Not on file  Social Connections: Not on file  Intimate Partner Violence: Not on file    Outpatient Medications Prior to Visit  Medication Sig Dispense Refill   aspirin EC 325 MG tablet Take 325 mg by mouth as needed for mild pain.     B Complex Vitamins (VITAMIN-B COMPLEX PO) Take by mouth.     chlorhexidine (HIBICLENS) 4 % external liquid 2-3times a week x 2weeks, then once a week as needed 409 mL 0   FOLIC ACID PO Take by mouth.     nitroGLYCERIN (  NITROSTAT) 0.4 MG SL tablet Place 1 tablet (0.4 mg total) under the tongue every 5 (five) minutes as needed for chest pain. Do not use more than 4tabs 20 tablet 0   Omega-3 Fatty Acids (OMEGA 3 PO) Take 1 tablet by mouth daily.     OVER THE COUNTER MEDICATION Aerie's- otc for eyes.     ciprofloxacin (CIPRO) 500 MG tablet Take 500 mg by mouth 2 (two) times daily.     No facility-administered medications prior to visit.    Allergies  Allergen Reactions   Atorvastatin Other (See Comments)    Muscle and joint pain.    ROS    See HPI  Objective:    Physical Exam Constitutional:      General: He is not in acute distress.    Appearance: He is well-developed.  HENT:     Head: Normocephalic and atraumatic.  Cardiovascular:     Rate and Rhythm: Normal rate and regular rhythm.     Heart sounds: No murmur heard. Pulmonary:     Effort: Pulmonary effort is normal. No respiratory distress.     Breath sounds: Normal breath sounds. No wheezing or rales.  Musculoskeletal:     Right lower leg: No edema.      Left lower leg: No edema.  Skin:    General: Skin is warm.  Neurological:     Mental Status: He is alert and oriented to person, place, and time.  Psychiatric:        Behavior: Behavior normal.        Thought Content: Thought content normal.    BP 103/65 (BP Location: Right Arm, Patient Position: Sitting, Cuff Size: Small)   Pulse 62   Temp 98.6 F (37 C) (Oral)   Resp 16   Wt 180 lb (81.6 kg)   SpO2 100%   BMI 24.41 kg/m  Wt Readings from Last 3 Encounters:  06/16/21 179 lb 6.4 oz (81.4 kg)  06/14/21 180 lb (81.6 kg)  04/03/21 179 lb 6 oz (81.4 kg)       Assessment & Plan:   Problem List Items Addressed This Visit       Unprioritized   Orthostatic hypotension    Work up thus far has been unrevealing. He meets back with cardiology on Friday. I reached out to his cardiologist re: initiation of fluorinef/support hose.  He will consider this and evaluate pt on Friday.        Other Visit Diagnoses     Arthritis    -  Primary   Relevant Orders   Ambulatory referral to Orthopedics   Need for pneumococcal vaccine       Relevant Orders   Pneumococcal conjugate vaccine 20-valent (Prevnar 20) (Completed)       I have discontinued Jakwon P. [place] April. "[name]"'s ciprofloxacin. I am also having him start on Shingrix. Additionally, I am having him maintain his aspirin EC, Omega-3 Fatty Acids (OMEGA 3 PO), B Complex Vitamins (VITAMIN-B COMPLEX PO), OVER THE COUNTER MEDICATION, FOLIC ACID PO, nitroGLYCERIN, and Hibiclens.  Meds ordered this encounter  Medications   Zoster Vaccine Adjuvanted [facility]) injection    Sig: 0.5mg  IM now and repeat in 2-6 months.    Dispense:  0.5 mL    Refill:  1    Order Specific Question:   Supervising Provider    Answer:   Penni Homans A [[id number]]

## 2021-06-15 ENCOUNTER — Encounter: Payer: Self-pay | Admitting: Family

## 2021-06-15 DIAGNOSIS — I951 Orthostatic hypotension: Secondary | ICD-10-CM | POA: Insufficient documentation

## 2021-06-15 HISTORY — DX: Orthostatic hypotension: I95.1

## 2021-06-15 NOTE — Telephone Encounter (Signed)
Per [facility] most recent colonoscopy is from July 2019. Records release faxed.

## 2021-06-15 NOTE — Assessment & Plan Note (Addendum)
Uncontrolled. Work up thus far has been unrevealing. He meets back with cardiology on Friday. I reached out to his cardiologist re: initiation of fluorinef/support hose.  He will consider this and evaluate pt on Friday.

## 2021-06-16 ENCOUNTER — Other Ambulatory Visit: Payer: Self-pay

## 2021-06-16 ENCOUNTER — Encounter: Payer: Self-pay | Admitting: Cardiovascular Disease

## 2021-06-16 ENCOUNTER — Ambulatory Visit (INDEPENDENT_AMBULATORY_CARE_PROVIDER_SITE_OTHER): Payer: Medicare Other | Admitting: Cardiovascular Disease

## 2021-06-16 VITALS — BP 122/76 | HR 62 | Ht 72.0 in | Wt 179.4 lb

## 2021-06-16 DIAGNOSIS — R0789 Other chest pain: Secondary | ICD-10-CM | POA: Diagnosis not present

## 2021-06-16 DIAGNOSIS — E782 Mixed hyperlipidemia: Secondary | ICD-10-CM | POA: Diagnosis not present

## 2021-06-16 DIAGNOSIS — R42 Dizziness and giddiness: Secondary | ICD-10-CM | POA: Diagnosis not present

## 2021-06-16 MED ORDER — FLUDROCORTISONE ACETATE 0.1 MG PO TABS: 0.1000 mg | ORAL_TABLET | Freq: Every day | ORAL | 3 refills | Status: DC

## 2021-06-16 NOTE — Progress Notes (Signed)
06/16/2021 Zachary Lawrence.   05-20-1951  [id number]  Primary Physician Debbrah Alar, NP Primary Cardiologist: Lorretta Harp MD Zachary Lawrence, [name]  HPI:  Zachary Lawrence. is a 70 y.o.  thin and fit appearing divorced Caucasian male father of 2, grandfather for grandchildren referred by Dr.Nche, his PCP, for evaluation of atypical chest pain.  I last saw him in the office 10/11/2020.  Nuclear was prepped He was evaluated 4 years ago by Dr. Percival Spanish with a negative GXT at that time for similar symptoms.  He works in Personal assistant.  His only risk factor is family history with father's who has had bypass surgery.  He is never had a heart attack or stroke.  He complains of quick fleeting sharp substernal chest pain that comes on for no apparent reason.  Also complains of some dyspnea exertion and dizziness.   He had a coronary CTA that revealed a coronary calcium score of 60 with distal LAD disease probably of no clinical significance.  A 2D echocardiogram performed 05/31/2020 revealed normal LV systolic function with grade 1 diastolic dysfunction and no valvular abnormalities other than mild MR.  Because of his episodic dizziness I did a 2-week Zio patch that showed short runs of SVT probably noncontributory.  I did begin him on low-dose atorvastatin because of an LDL of 111 to intensify his primary prevention which she did not tolerate.  Continues to have episodes of episodic dizziness occurring several times a week lasting for seconds to a minute at a time without loss of consciousness.  His primary care provider, Debbrah Alar NP asked about starting Florinef which I think would be a good empiric therapy.   Current Meds  Medication Sig   aspirin EC 325 MG tablet Take 325 mg by mouth as needed for mild pain.   B Complex Vitamins (VITAMIN-B COMPLEX PO) Take by mouth.   chlorhexidine (HIBICLENS) 4 % external liquid 2-3times a week x 2weeks, then once a week as needed   FOLIC  ACID PO Take by mouth.   nitroGLYCERIN (NITROSTAT) 0.4 MG SL tablet Place 1 tablet (0.4 mg total) under the tongue every 5 (five) minutes as needed for chest pain. Do not use more than 4tabs   Omega-3 Fatty Acids (OMEGA 3 PO) Take 1 tablet by mouth daily.   OVER THE COUNTER MEDICATION Aerie's- otc for eyes.   Zoster Vaccine Adjuvanted [facility]) injection 0.5mg  IM now and repeat in 2-6 months.     Allergies  Allergen Reactions   Atorvastatin Other (See Comments)    Muscle and joint pain.    Social History   Socioeconomic History   Marital status: Divorced    Spouse name: Not on file   Number of children: 2   Years of education: Not on file   Highest education level: Not on file  Occupational History   Occupation: realtor  Tobacco Use   Smoking status: Former    Pack years: 0.00    Types: Cigarettes    Quit date: 12/11/1975    Years since quitting: 45.5   Smokeless tobacco: Never  Vaping Use   Vaping Use: Never used  Substance and Sexual Activity   Alcohol use: Yes    Comment: social   Drug use: No   Sexual activity: Yes  Other Topics Concern   Not on file  Social History Narrative   Lives alone.     Social Determinants of Health   Financial Resource Strain:  Not on file  Food Insecurity: Not on file  Transportation Needs: Not on file  Physical Activity: Not on file  Stress: Not on file  Social Connections: Not on file  Intimate Partner Violence: Not on file     Review of Systems: General: negative for chills, fever, night sweats or weight changes.  Cardiovascular: negative for chest pain, dyspnea on exertion, edema, orthopnea, palpitations, paroxysmal nocturnal dyspnea or shortness of breath Dermatological: negative for rash Respiratory: negative for cough or wheezing Urologic: negative for hematuria Abdominal: negative for nausea, vomiting, diarrhea, bright red blood per rectum, melena, or hematemesis Neurologic: negative for visual changes, syncope, or  dizziness All other systems reviewed and are otherwise negative except as noted above.    Blood pressure 122/76, pulse 62, height 6' (1.829 m), weight 179 lb 6.4 oz (81.4 kg), SpO2 98 %.  General appearance: alert and no distress Neck: no adenopathy, no carotid bruit, no JVD, supple, symmetrical, trachea midline, and thyroid not enlarged, symmetric, no tenderness/mass/nodules Lungs: clear to auscultation bilaterally Heart: regular rate and rhythm, S1, S2 normal, no murmur, click, rub or gallop Extremities: extremities normal, atraumatic, no cyanosis or edema Pulses: 2+ and symmetric Skin: Skin color, texture, turgor normal. No rashes or lesions Neurologic: Grossly normal  EKG sinus rhythm at 62 without ST or T wave changes.  I personally reviewed this EKG.  ASSESSMENT AND PLAN:   Dizziness History of recurrent dizziness for unclear reasons.  He did have a Zio patch which showed some short episodes of SVT probably noncontributory.  There may be a orthostatic component.  I am going to try Florinef 0.1 mg p.o. twice daily.  Atypical chest pain History of atypical chest pain in the past with negative GXT remotely coronary calcium score of 60.  He has not had chest pain recently.  Hyperlipidemia Started history of hyperlipidemia with LDL of 111 in the past intolerant to statin therapy.     Lorretta Harp MD FACP,FACC,FAHA, [facility] 06/16/2021 10:32 AM

## 2021-06-16 NOTE — Assessment & Plan Note (Signed)
History of atypical chest pain in the past with negative GXT remotely coronary calcium score of 60.  He has not had chest pain recently.

## 2021-06-16 NOTE — Patient Instructions (Addendum)
Medication Instructions:  START: FLORINEF - 0.1mg  ONCE DAILY IN THE MORNING  *If you need a refill on your cardiac medications before your next appointment, please call your pharmacy*  Follow-Up: At [facility], you and your health needs are our priority.  As part of our continuing mission to provide you with exceptional heart care, we have created designated Provider Care Teams.  These Care Teams include your primary Cardiologist (physician) and Advanced Practice Providers (APPs -  Physician Assistants and Nurse Practitioners) who all work together to provide you with the care you need, when you need it.  Your next appointment:   6 month(s) PLEASE SEE [facility] ([facility]) in 4 WEEKS FOR BP CHECK  The format for your next appointment:   In Person  Provider:   Quay Burow, MD  Other Instructions PLEASE MONITOR BLOOD PRESSURE DAILY FOR 30 DAYS- WRITE THIS DOWN AND BRING BP LOG BACK TO YOUR APPOINTMENT WITH [facility] ([facility]

## 2021-06-16 NOTE — Assessment & Plan Note (Signed)
History of recurrent dizziness for unclear reasons.  He did have a Zio patch which showed some short episodes of SVT probably noncontributory.  There may be a orthostatic component.  I am going to try Florinef 0.1 mg p.o. twice daily.

## 2021-06-16 NOTE — Assessment & Plan Note (Signed)
Started history of hyperlipidemia with LDL of 111 in the past intolerant to statin therapy.

## 2021-06-23 ENCOUNTER — Encounter: Payer: Self-pay | Admitting: Orthopaedic Surgery

## 2021-06-23 ENCOUNTER — Ambulatory Visit: Payer: Self-pay

## 2021-06-23 ENCOUNTER — Ambulatory Visit (INDEPENDENT_AMBULATORY_CARE_PROVIDER_SITE_OTHER): Payer: Medicare Other

## 2021-06-23 ENCOUNTER — Ambulatory Visit (INDEPENDENT_AMBULATORY_CARE_PROVIDER_SITE_OTHER): Payer: Medicare Other | Admitting: Orthopaedic Surgery

## 2021-06-23 ENCOUNTER — Other Ambulatory Visit: Payer: Self-pay

## 2021-06-23 VITALS — Ht 72.0 in | Wt 180.0 lb

## 2021-06-23 DIAGNOSIS — M1811 Unilateral primary osteoarthritis of first carpometacarpal joint, right hand: Secondary | ICD-10-CM

## 2021-06-23 DIAGNOSIS — M1812 Unilateral primary osteoarthritis of first carpometacarpal joint, left hand: Secondary | ICD-10-CM

## 2021-06-23 MED ORDER — MELOXICAM 7.5 MG PO TABS: 7.5000 mg | ORAL_TABLET | Freq: Two times a day (BID) | ORAL | 2 refills | Status: DC | PRN

## 2021-06-23 NOTE — Progress Notes (Signed)
Office Visit Note   Patient: Zachary Lawrence.           Date of Birth: [birth year]           MRN: [id number] Visit Date: 06/23/2021              Requested by: Debbrah Alar, NP [address] [place],  [place] [address] PCP: Debbrah Alar, NP   Assessment & Plan: Visit Diagnoses:  1. Primary osteoarthritis of first carpometacarpal joint of right hand   2. Primary osteoarthritis of first carpometacarpal joint of left hand     Plan: Based on findings he does have advanced basal joint arthritis however sounds like his main issue is trouble with lifting free weights.  He states that he can give that up if needed in order to improve the pain.  Sounds like he is okay when it comes to daily activities.  He feels like he can live with this pain as is.  He is interested in trying meloxicam as well as Voltaren gel.  He will try using the brace again to see if this will be of any benefit.  I explained that surgery would probably not give him the ability to lift 25 pound free weights due to the decrease in strength that patients usually experience after the surgery even though they experience pain relief.  We will see him back as needed.  Follow-Up Instructions: Return if symptoms worsen or fail to improve.   Orders:  Orders Placed This Encounter  Procedures   XR Hand Complete Left   XR Hand Complete Right   Meds ordered this encounter  Medications   meloxicam (MOBIC) 7.5 MG tablet    Sig: Take 1 tablet (7.5 mg total) by mouth 2 (two) times daily as needed for pain.    Dispense:  30 tablet    Refill:  2      Procedures: No procedures performed   Clinical Data: No additional findings.   Subjective: Chief Complaint  Patient presents with   Left Hand - Pain   Right Hand - Pain    Zachary Lawrence is a very pleasant 70 year old gentleman referral from PCP for evaluation of bilateral thumb pain at the basal joint for about a year and that is worse on the right.  He has  some difficulty with opening jars but the main issue is that he is not able to lift 25 pound free weights at the gym.  He has no problems with using machines.  Denies any triggering or numbness or tingling.  He has had a previous cortisone injection which hurt really badly.  He was previously prescribed a supportive brace by his PCP which he still has.  He has not tried any topical medications.  He has tried over-the-counter medications.   Review of Systems  Constitutional: Negative.   All other systems reviewed and are negative.   Objective: Vital Signs: Ht 6' (1.829 m)   Wt 180 lb (81.6 kg)   BMI 24.41 kg/m   Physical Exam Vitals and nursing note reviewed.  Constitutional:      Appearance: He is well-developed.  HENT:     Head: Normocephalic and atraumatic.  Eyes:     Pupils: Pupils are equal, round, and reactive to light.  Pulmonary:     Effort: Pulmonary effort is normal.  Abdominal:     Palpations: Abdomen is soft.  Musculoskeletal:        General: Normal range of motion.  Cervical back: Neck supple.  Skin:    General: Skin is warm.  Neurological:     Mental Status: He is alert and oriented to person, place, and time.  Psychiatric:        Behavior: Behavior normal.        Thought Content: Thought content normal.        Judgment: Judgment normal.    Ortho Exam Bilateral thumbs showed no triggering.  Range of motion is well-preserved.  He has crepitus and pain with grind test.  Negative carpal tunnel compressive signs. Specialty Comments:  No specialty comments available.  Imaging: XR Hand Complete Left  Result Date: 06/23/2021 Advanced thumb basal joint arthritis   XR Hand Complete Right  Result Date: 06/23/2021 Advanced thumb basal joint arthritis    PMFS History: Patient Active Problem List   Diagnosis Date Noted   Orthostatic hypotension 06/15/2021   History of hepatitis C    Hyperlipidemia 10/11/2020   Atypical chest pain 05/10/2020   Rhus  dermatitis 03/19/2019   Dizziness 03/06/2016   Dyspnea on exertion 03/06/2016   Benign non-nodular prostatic hyperplasia with lower urinary tract symptoms 02/09/2016   History of acute hepatitis 09/09/2014   Esophageal reflux 09/09/2014   Allergic rhinitis 09/09/2014   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     Orthostatic hypotension 06/15/2021   Overactive bladder     Family History  Problem Relation Age of Onset   CAD Father 60       CABG/Stent   Heart disease Father        CAD with CABG and stent   Ovarian cancer Sister    Alzheimer's disease Mother    Heart disease Paternal Uncle    Cancer Paternal Uncle        prostate cancer    Past Surgical History:  Procedure Laterality Date   CHOLECYSTECTOMY  51   TONSILLECTOMY     Social History   Occupational History   Occupation: Cabin crew  Tobacco Use   Smoking status: Former    Types: Cigarettes    Quit date: 12/11/1975    Years since quitting: 45.5   Smokeless tobacco: Never  Vaping Use   Vaping Use: Never used  Substance and Sexual Activity   Alcohol use: Yes    Comment: social   Drug use: No   Sexual activity: Yes

## 2021-06-29 ENCOUNTER — Encounter: Payer: Self-pay | Admitting: Family

## 2021-07-03 DIAGNOSIS — L821 Other seborrheic keratosis: Secondary | ICD-10-CM | POA: Diagnosis not present

## 2021-07-03 DIAGNOSIS — D485 Neoplasm of uncertain behavior of skin: Secondary | ICD-10-CM | POA: Diagnosis not present

## 2021-07-03 DIAGNOSIS — D044 Carcinoma in situ of skin of scalp and neck: Secondary | ICD-10-CM | POA: Diagnosis not present

## 2021-07-03 DIAGNOSIS — L578 Other skin changes due to chronic exposure to nonionizing radiation: Secondary | ICD-10-CM | POA: Diagnosis not present

## 2021-07-03 DIAGNOSIS — L570 Actinic keratosis: Secondary | ICD-10-CM | POA: Diagnosis not present

## 2021-07-14 ENCOUNTER — Other Ambulatory Visit: Payer: Self-pay

## 2021-07-14 ENCOUNTER — Ambulatory Visit (INDEPENDENT_AMBULATORY_CARE_PROVIDER_SITE_OTHER): Payer: Medicare Other | Admitting: Pharmacist Clinician (PhC)/ Clinical Pharmacy Specialist

## 2021-07-14 DIAGNOSIS — I951 Orthostatic hypotension: Secondary | ICD-10-CM | POA: Diagnosis not present

## 2021-07-14 MED ORDER — FLUDROCORTISONE ACETATE 0.1 MG PO TABS: 0.2000 mg | ORAL_TABLET | Freq: Every day | ORAL | 3 refills | Status: DC

## 2021-07-14 NOTE — Patient Instructions (Signed)
Return for a a follow up appointment September 6 at 9 am  Check your blood pressure at home 3-4 days each week and keep record of the readings.  Take your BP meds as follows:  Increase Fludrocortisone to 0.2 mg once daily  Bring all of your meds, your BP cuff and your record of home blood pressures to your next appointment.  Exercise as you're able, try to walk approximately 30 minutes per day.  Keep salt intake to a minimum, especially watch canned and prepared boxed foods.  Eat more fresh fruits and vegetables and fewer canned items.  Avoid eating in fast food restaurants.    HOW TO TAKE YOUR BLOOD PRESSURE: Rest 5 minutes before taking your blood pressure.  Don't smoke or drink caffeinated beverages for at least 30 minutes before. Take your blood pressure before (not after) you eat. Sit comfortably with your back supported and both feet on the floor (don't cross your legs). Elevate your arm to heart level on a table or a desk. Use the proper sized cuff. It should fit smoothly and snugly around your bare upper arm. There should be enough room to slip a fingertip under the cuff. The bottom edge of the cuff should be 1 inch above the crease of the elbow. Ideally, take 3 measurements at one sitting and record the average.

## 2021-07-14 NOTE — Progress Notes (Signed)
07/14/2021 Zachary Lawrence. 07/17/1951 [id number]   HPI:  Zachary Masten. is a 70 y.o. male patient of Dr Gwenlyn Found, with a PMH below who presents today for orthostatic hypotension evaluation.  Dr Gwenlyn Found saw him last month and noted that his blood pressure reading was good in the office, however patient had been having trouble with ongoing episodic dizziness.  It was noted that this occurs several times each week and can last up to a minute.   He has not had any loss of consciousness.  He was started on fludrocortisone 0.1 mg daily.  He was asked to monitor blood pressure at home and return in a month for follow up.  He was seen by Endocrinology in April for orthostatic hypotension.  He tested negative for adrenal insufficiency.    Today he is in the office for follow up.  He notes he has been looking for patterns to the hypotensive spells, but has not managed to find any common denominators.  Clarifies that there is no dizziness, just a feeling of lightheadedness and weakness.  Vision blurs somewhat, but doesn't completely go.  Has not noticed any changes in his hearing.   Dr. Gwenlyn Found started him on fludrocortisone 0.1 mg last month and today he feels that there has been no change in his frequency of events.  He loves to ride his Zachary Lawrence, but has not in the past 6+ months for fear of having an episode while on the bike.  He still drives and states has not had any concerns while driving as of yet.    Past Medical History: Atypical chest pain Negative GXT, coronary calcium score 60; no pain recently  hyperlipidemia LDL 111 - intolerant to statin therapy     Blood Pressure Goal:  130/80  Current Medications: fludrocortisone 0.1 mg qd  Family Hx: father now 4, had mild MI 25+ years ago CABG, valve repairs, mild stroke; mother died dementia at 19; 1 sister ovarian cancer; 2 children healthy  Social Hx: no, occasional beer or mixed drinks on weekends; no caffeine soda, occasional tea  Diet: eats  from deli/prepared meal counter at [employer], avoids fast foods; likes vegetables; admits he probably doesn't drink as much water as he should  Exercise: uses machines at gym, avoids treadmill and elliptical  Home BP readings:   23 AM readings, average 118/73  4 (noon-3) readings average 90/57  22 evening readings average 105/67  Intolerances: atorvastatin - myagias  Labs: 4/22:  Na 139, K 4.1, Glu 94, BUN 23, SCr 1.28   Wt Readings from Last 3 Encounters:  07/14/21 176 lb 14.4 oz (80.2 kg)  06/23/21 180 lb (81.6 kg)  06/16/21 179 lb 6.4 oz (81.4 kg)   BP Readings from Last 3 Encounters:  07/14/21 (!) 150/82  06/16/21 122/76  06/14/21 103/65   Pulse Readings from Last 3 Encounters:  07/14/21 (!) 56  06/16/21 62  06/14/21 62    Current Outpatient Medications  Medication Sig Dispense Refill   fludrocortisone (FLORINEF) 0.1 MG tablet Take 2 tablets (0.2 mg total) by mouth daily. 60 tablet 3   aspirin EC 325 MG tablet Take 325 mg by mouth as needed for mild pain.     B Complex Vitamins (VITAMIN-B COMPLEX PO) Take by mouth.     chlorhexidine (HIBICLENS) 4 % external liquid 2-3times a week x 2weeks, then once a week as needed 123456 mL 0   FOLIC ACID PO Take by mouth.  meloxicam (MOBIC) 7.5 MG tablet Take 1 tablet (7.5 mg total) by mouth 2 (two) times daily as needed for pain. 30 tablet 2   nitroGLYCERIN (NITROSTAT) 0.4 MG SL tablet Place 1 tablet (0.4 mg total) under the tongue every 5 (five) minutes as needed for chest pain. Do not use more than 4tabs 20 tablet 0   Omega-3 Fatty Acids (OMEGA 3 PO) Take 1 tablet by mouth daily.     OVER THE COUNTER MEDICATION Aerie's- otc for eyes.     Zoster Vaccine Adjuvanted [facility]) injection 0.'5mg'$  IM now and repeat in 2-6 months. 0.5 mL 1   No current facility-administered medications for this visit.    Allergies  Allergen Reactions   Atorvastatin Other (See Comments)    Muscle and joint pain.    Past Medical History:  Diagnosis  Date   GERD (gastroesophageal reflux disease)    History of hepatitis C    s/p curative therapy in the 90's.     Orthostatic hypotension 06/15/2021   Overactive bladder     Blood pressure (!) 150/82, pulse (!) 56, height 6' (1.829 m), weight 176 lb 14.4 oz (80.2 kg). BP right arm 164/100 Left arm standing 122/78 Left arm standing after 1 minute 142/80  Orthostatic hypotension Patient with orthostatic hypotension, in the office today with a 28/10 point drop upon standing.  He was completely asymptomatic for this.  Right arm pressure was 14/18 points higher than left arm, so will double check that at his next visit.  Reviewed possible reasons for BP drops at home including positional changes, timing after meals and hydration status.  Patient doesn't believe any of these come into play, but will continue to monitor symptoms at home.  Will increase the fludrocortisone to 0.2 mg for a month to determine if any improvement in symptoms.  If still no change would consider discontinuing.  He should continue with home monitoring 3-4 days each week and return in one month for follow up.     Tommy Medal PharmD CPP [facility] [address] [place], [place] [address] [phone]

## 2021-07-14 NOTE — Assessment & Plan Note (Signed)
Patient with orthostatic hypotension, in the office today with a 28/10 point drop upon standing.  He was completely asymptomatic for this.  Right arm pressure was 14/18 points higher than left arm, so will double check that at his next visit.  Reviewed possible reasons for BP drops at home including positional changes, timing after meals and hydration status.  Patient doesn't believe any of these come into play, but will continue to monitor symptoms at home.  Will increase the fludrocortisone to 0.2 mg for a month to determine if any improvement in symptoms.  If still no change would consider discontinuing.  He should continue with home monitoring 3-4 days each week and return in one month for follow up.

## 2021-07-27 ENCOUNTER — Telehealth: Payer: Self-pay

## 2021-07-27 NOTE — Telephone Encounter (Signed)
Lmom to r/s to a different day as we will be short a pharmd

## 2021-07-28 ENCOUNTER — Telehealth: Payer: Self-pay

## 2021-07-28 NOTE — Telephone Encounter (Signed)
Lmom to R/s pharmd appt as we will be down to one pharmd that day

## 2021-08-04 DIAGNOSIS — N138 Other obstructive and reflux uropathy: Secondary | ICD-10-CM | POA: Diagnosis not present

## 2021-08-04 DIAGNOSIS — R339 Retention of urine, unspecified: Secondary | ICD-10-CM | POA: Diagnosis not present

## 2021-08-04 DIAGNOSIS — N401 Enlarged prostate with lower urinary tract symptoms: Secondary | ICD-10-CM | POA: Diagnosis not present

## 2021-08-04 DIAGNOSIS — R3589 Other polyuria: Secondary | ICD-10-CM | POA: Diagnosis not present

## 2021-08-15 ENCOUNTER — Ambulatory Visit: Payer: Medicare Other

## 2021-08-25 ENCOUNTER — Other Ambulatory Visit: Payer: Self-pay

## 2021-08-25 ENCOUNTER — Ambulatory Visit (INDEPENDENT_AMBULATORY_CARE_PROVIDER_SITE_OTHER): Payer: Medicare Other | Admitting: Pharmacist

## 2021-08-25 VITALS — BP 124/82 | HR 65

## 2021-08-25 DIAGNOSIS — I951 Orthostatic hypotension: Secondary | ICD-10-CM

## 2021-08-25 NOTE — Patient Instructions (Addendum)
It was nice meeting you today  Continue the fludrocortisone 0.'2mg'$  (two of your 0.'1mg'$  tablets) once a day in the morning  Keep track of your "incidents" over the next week and the time of day they are happening and any activities you were doing while it occurred  Increase your fluid consumption and increase salt intake  Continue to track your blood pressure at home  Limit outdoor activities in which you might sweat  Consider increasing the number of pillows you use or tilting your bed  Remember to stand up slowly from a sitting or resting position  We will contact you in a week to check on your symptoms  Karren Cobble, PharmD, BCACP, [place], [place] [address] [place], [place] [address] Phone: [phone]; Fax: [phone] 08/25/2021 12:15 PM

## 2021-08-25 NOTE — Progress Notes (Signed)
Patient ID: Zachary Lawrence.                 DOB: 23-May-1951                      MRN: [id number]    HPI:  Zachary Lawrence. is a 70 y.o. male patient of Dr Gwenlyn Found, with a PMH below who presents today for orthostatic hypotension evaluation.  Dr Gwenlyn Found saw him last month and noted that his blood pressure reading was good in the office, however patient had been having trouble with ongoing episodic dizziness.  It was noted that this occurs several times each week and can last up to a minute.   He has not had any loss of consciousness.  He was started on fludrocortisone 0.1 mg daily.  He was asked to monitor blood pressure at home and return in a month for follow up.  He was seen by Endocrinology in April for orthostatic hypotension.  He tested negative for adrenal insufficiency.     Today he is in the office for follow up.  He notes he has been looking for patterns to the hypotensive spells, but has not managed to find any common denominators.  Clarifies that there is no dizziness, just a feeling of lightheadedness and weakness.  Vision blurs somewhat, but doesn't completely go.  Has not noticed any changes in his hearing.   Dr. Gwenlyn Found started him on fludrocortisone 0.1 mg last month and today he feels that there has been no change in his frequency of events.  He loves to ride his [employer], but has not in the past 6+ months for fear of having an episode while on the bike.  He still drives and states has not had any concerns while driving as of yet. Fludrocortisone was increased to 0.'2mg'$  daily and patient was asked to continue to home monitor BP and look for trends or activities that may be contributing.  Patient presents today in good spirits.  Reports that increase in fludrocortisone has been helpful. He has not had as many incidents in the past but they are are still present.  Brought BP log, has been testing at various times during the day. 8/9: 122/74 8/10: 125/80 8/11: 151/85 8/13: 141/84 8/14:  143/85 8/15: 127/74 8/16: 165/94 8/18: 120/73 8/20: 166/96 8/22: 101/62 8/23: 147/89 8/24: 134/84 8/25: 117/75 8/26: 139/81 8/27: 133/79, 104/55 8/29: 156/88 8/30: 146/81 8/31: 153/95 9/1: 136/85 9/2: 143/84 9/4: 101/67 9/6: 128/77 9/7: 92/65 9/8: 129/84 9/9: 149/89 9/11: 135/91 9/12: 144/87 9/13: 109/73 9/14: 119/78 9/15: 112/68 9/16: 98/67  Reports he rarely feels dizzy when blood pressure drops and it does not tend to be when rising from laying down to standing up or sitting to standing up.  He says when his BP drops he feels "numbness, lightheadness" and sometimes feels like he would have to drop to one knee.    Blood pressure readings in room: Sitting: 124/82 (manual) Sitting: 117/74 (electronic) Standing: 107/74 (electronic) Standing: 110/76 (manual)  Has not taken medication today since he ran out and did not pick up refill yet since he was not sure if medication would be changed at this visit.  Wt Readings from Last 3 Encounters:  07/14/21 176 lb 14.4 oz (80.2 kg)  06/23/21 180 lb (81.6 kg)  06/16/21 179 lb 6.4 oz (81.4 kg)   BP Readings from Last 3 Encounters:  07/14/21 (!) 150/82  06/16/21 122/76  06/14/21 103/65   Pulse  Readings from Last 3 Encounters:  07/14/21 (!) 56  06/16/21 62  06/14/21 62    Renal function: CrCl cannot be calculated (Patient's most recent lab result is older than the maximum 21 days allowed.).  Past Medical History:  Diagnosis Date   GERD (gastroesophageal reflux disease)    History of hepatitis C    s/p curative therapy in the 90's.     Orthostatic hypotension 06/15/2021   Overactive bladder     Current Outpatient Medications on File Prior to Visit  Medication Sig Dispense Refill   aspirin EC 325 MG tablet Take 325 mg by mouth as needed for mild pain.     B Complex Vitamins (VITAMIN-B COMPLEX PO) Take by mouth.     chlorhexidine (HIBICLENS) 4 % external liquid 2-3times a week x 2weeks, then once a week as needed  120 mL 0   fludrocortisone (FLORINEF) 0.1 MG tablet Take 2 tablets (0.2 mg total) by mouth daily. 60 tablet 3   FOLIC ACID PO Take by mouth.     meloxicam (MOBIC) 7.5 MG tablet Take 1 tablet (7.5 mg total) by mouth 2 (two) times daily as needed for pain. 30 tablet 2   nitroGLYCERIN (NITROSTAT) 0.4 MG SL tablet Place 1 tablet (0.4 mg total) under the tongue every 5 (five) minutes as needed for chest pain. Do not use more than 4tabs 20 tablet 0   Omega-3 Fatty Acids (OMEGA 3 PO) Take 1 tablet by mouth daily.     OVER THE COUNTER MEDICATION Aerie's- otc for eyes.     tamsulosin (FLOMAX) 0.4 MG CAPS capsule Take by mouth.     Zoster Vaccine Adjuvanted [facility]) injection 0.'5mg'$  IM now and repeat in 2-6 months. 0.5 mL 1   No current facility-administered medications on file prior to visit.    Allergies  Allergen Reactions   Atorvastatin Other (See Comments)    Muscle and joint pain.     Assessment/Plan:  1. Orthostatic hypotension - Patient continues to have occasional hypotension although not as severe as previous.  BP dropped ~10-15 points when rapidly rising from sitting to standing position. Is tolerating fludrocortisone dose increase but had not taken it yet today. Before patient arrived, had considered switching to midodrine if patient was unresponsive to fludrocortisone increase however he reports it is helping.  Discussed lifestyle changes in detail with patient and gave handouts.    -Rising slowly from supine or sitting positions -Avoiding Valsalva like maneuvers -Avoid exercising in humidity or other temperatures in which he may sweat -Raising head of bed to a 30-45 degree angle or using extra pillows -Increase fluid consumption -Increase sodium consumption  Patient voiced understanding and reported he will work on lifestyle modifications and continue to track BP at home. Recommended he record what he is doing if he has any "incidents" and to check BP if able.  Will continue  fludrocortisone 0.'2mg'$  daily and check on patient status in 1 week.  Karren Cobble, PharmD, BCACP, [place], [place] [id number], [place], [place] [address] Phone: [phone]; Fax: [phone] 08/28/2021 9:45 AM

## 2021-08-31 ENCOUNTER — Emergency Department (HOSPITAL_BASED_OUTPATIENT_CLINIC_OR_DEPARTMENT_OTHER)
Admission: EM | Admit: 2021-08-31 | Discharge: 2021-08-31 | Disposition: A | Discharge status: Home / Self Care | Payer: Medicare Other | Attending: Emergency Medicine | Admitting: Emergency Medicine

## 2021-08-31 ENCOUNTER — Other Ambulatory Visit: Payer: Self-pay

## 2021-08-31 ENCOUNTER — Emergency Department (HOSPITAL_BASED_OUTPATIENT_CLINIC_OR_DEPARTMENT_OTHER): Payer: Medicare Other

## 2021-08-31 ENCOUNTER — Encounter (HOSPITAL_BASED_OUTPATIENT_CLINIC_OR_DEPARTMENT_OTHER): Payer: Self-pay

## 2021-08-31 DIAGNOSIS — I10 Essential (primary) hypertension: Secondary | ICD-10-CM | POA: Insufficient documentation

## 2021-08-31 DIAGNOSIS — I6523 Occlusion and stenosis of bilateral carotid arteries: Secondary | ICD-10-CM | POA: Diagnosis not present

## 2021-08-31 DIAGNOSIS — Z87891 Personal history of nicotine dependence: Secondary | ICD-10-CM | POA: Diagnosis not present

## 2021-08-31 DIAGNOSIS — H8110 Benign paroxysmal vertigo, unspecified ear: Secondary | ICD-10-CM | POA: Diagnosis not present

## 2021-08-31 DIAGNOSIS — R42 Dizziness and giddiness: Secondary | ICD-10-CM

## 2021-08-31 DIAGNOSIS — I672 Cerebral atherosclerosis: Secondary | ICD-10-CM | POA: Diagnosis not present

## 2021-08-31 LAB — URINALYSIS, ROUTINE W REFLEX MICROSCOPIC
Bilirubin Urine: NEGATIVE
Glucose, UA: NEGATIVE mg/dL
Ketones, ur: NEGATIVE mg/dL
Leukocytes,Ua: NEGATIVE
Nitrite: NEGATIVE
Protein, ur: NEGATIVE mg/dL
Specific Gravity, Urine: 1.02 (ref 1.005–1.030)
pH: 7.5 (ref 5.0–8.0)

## 2021-08-31 LAB — CBC
HCT: 40.5 % (ref 39.0–52.0)
Hemoglobin: 14.3 g/dL (ref 13.0–17.0)
MCH: 32.1 pg (ref 26.0–34.0)
MCHC: 35.3 g/dL (ref 30.0–36.0)
MCV: 91 fL (ref 80.0–100.0)
Platelets: 164 10*3/uL (ref 150–400)
RBC: 4.45 MIL/uL (ref 4.22–5.81)
RDW: 12.6 % (ref 11.5–15.5)
WBC: 5.9 10*3/uL (ref 4.0–10.5)
nRBC: 0 % (ref 0.0–0.2)

## 2021-08-31 LAB — URINALYSIS, MICROSCOPIC (REFLEX)

## 2021-08-31 LAB — BASIC METABOLIC PANEL
Anion gap: 7 (ref 5–15)
BUN: 19 mg/dL (ref 8–23)
CO2: 30 mmol/L (ref 22–32)
Calcium: 9.1 mg/dL (ref 8.9–10.3)
Chloride: 102 mmol/L (ref 98–111)
Creatinine, Ser: 1.22 mg/dL (ref 0.61–1.24)
GFR, Estimated: >60 mL/min (ref >60)
Glucose, Bld: 133 mg/dL — ABNORMAL HIGH (ref 70–99)
Potassium: 4 mmol/L (ref 3.5–5.1)
Sodium: 139 mmol/L (ref 135–145)

## 2021-08-31 LAB — CBG MONITORING, ED: Glucose-Capillary: 128 mg/dL — ABNORMAL HIGH (ref 70–99)

## 2021-08-31 LAB — TROPONIN I (HIGH SENSITIVITY)
Troponin I (High Sensitivity): 2 ng/L (ref <18)
Troponin I (High Sensitivity): <2 ng/L (ref <18)

## 2021-08-31 MED ORDER — IOHEXOL 350 MG/ML SOLN
75.0000 mL | Freq: Once | INTRAVENOUS | Status: AC | PRN
  Administered 2021-08-31: 75 mL via INTRAVENOUS

## 2021-08-31 MED ORDER — SODIUM CHLORIDE 0.9 % IV BOLUS
1000.0000 mL | Freq: Once | INTRAVENOUS | Status: AC
  Administered 2021-08-31: 1000 mL via INTRAVENOUS

## 2021-08-31 MED ORDER — MECLIZINE HCL 25 MG PO TABS
25.0000 mg | ORAL_TABLET | Freq: Once | ORAL | Status: AC
  Administered 2021-08-31: 25 mg via ORAL
  Filled 2021-08-31: qty 1

## 2021-08-31 MED ORDER — MECLIZINE HCL 25 MG PO TABS: 25.0000 mg | ORAL_TABLET | Freq: Three times a day (TID) | ORAL | 0 refills | Status: DC | PRN

## 2021-08-31 NOTE — ED Notes (Signed)
Patient transported to CT 

## 2021-08-31 NOTE — ED Notes (Signed)
Patient given discharge instructions, all questions answered. Patient in possession of all belongings, directed to the discharge area  

## 2021-08-31 NOTE — Discharge Instructions (Addendum)
Follow-up with your primary doctor.  Please have your blood pressure rechecked in the office.  Call their clinic tomorrow morning to request an appointment.  Take the meclizine as needed.  If you develop significant worsening of your symptoms, difficulty walking, any vision change, speech change, numbness, weakness or other new concerning symptom, come back to ER for reassessment.  Note the meclizine can make you mildly drowsy and should not be taken while driving or operating heavy machinery.

## 2021-08-31 NOTE — ED Triage Notes (Addendum)
Pt states he was lying flat on the floor preparing to do sit ups ~1hour PTA-"felt like I was moving-like the ceiling was moving"-states feeling is worse when he looks down and turns head-states he has been having intermittent lightheadedness and near syncope episodes x 1 year with multiple testing-denies pain at this time and during event when he was lying flat-NAD-to triage in w/c

## 2021-08-31 NOTE — ED Provider Notes (Signed)
[facility] Provider Note   CSN: [id number] Arrival date & time: 08/31/21  1652     History Chief Complaint  Patient presents with   Dizziness    Zachary Lawrence. is a 70 y.o. male.  Presents to ER with concern for dizziness.  Patient reports that while he was on the floor getting ready to do sit ups he started having a room spinning sensation.  States that the symptoms were worse with movement and improved with rest.  Currently does not feel dizzy however he does feel that if he tried to move suddenly then this would really trigger the dizziness.  Feels mildly generally weak but denies any focal weakness.  No numbness or tingling.  No speech or vision change.  No nausea or vomiting.  States that he has had issues with lightheadedness episodes previously which have been extensively worked up by primary doctor and cardiology without any identifiable cause.    HPI     Past Medical History:  Diagnosis Date   GERD (gastroesophageal reflux disease)    History of hepatitis C    s/p curative therapy in the 90's.     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    Past Surgical History:  Procedure Laterality Date   CHOLECYSTECTOMY  1992   TONSILLECTOMY         Family History  Problem Relation Age of Onset   CAD Father 65       CABG/Stent   Heart disease Father        CAD with CABG and stent   Ovarian cancer Sister    Alzheimer's disease Mother     Heart disease Paternal Uncle    Cancer Paternal Uncle        prostate cancer    Social History   Tobacco Use   Smoking status: Former    Types: Cigarettes    Quit date: 12/11/1975    Years since quitting: 45.7   Smokeless tobacco: Never  Vaping Use   Vaping Use: Never used  Substance Use Topics   Alcohol use: Yes    Comment: social   Drug use: No    Home Medications Prior to Admission medications   Medication Sig Start Date End Date Taking? Authorizing Provider  meclizine (ANTIVERT) 25 MG tablet Take 1 tablet (25 mg total) by mouth 3 (three) times daily as needed for dizziness. 08/31/21  Yes Zachary Starch, MD  aspirin EC 325 MG tablet Take 325 mg by mouth as needed for mild pain.    [provider]  B Complex Vitamins (VITAMIN-B COMPLEX PO) Take by mouth.    [provider]  chlorhexidine (HIBICLENS) 4 % external liquid 2-3times a week x 2weeks, then once a week as needed 07/27/20   Nche, Charlene Brooke, NP  fludrocortisone (FLORINEF) 0.1 MG tablet Take 2 tablets (0.2 mg total) by mouth daily. 07/14/21   Lorretta Harp, MD  FOLIC ACID PO Take by mouth.  [provider]  meloxicam (MOBIC) 7.5 MG tablet Take 1 tablet (7.5 mg total) by mouth 2 (two) times daily as needed for pain. 06/23/21   Leandrew Koyanagi, MD  nitroGLYCERIN (NITROSTAT) 0.4 MG SL tablet Place 1 tablet (0.4 mg total) under the tongue every 5 (five) minutes as needed for chest pain. Do not use more than 4tabs 04/28/20   Nche, Charlene Brooke, NP  Omega-3 Fatty Acids (OMEGA 3 PO) Take 1 tablet by mouth daily.    [provider]  OVER THE COUNTER MEDICATION Aerie's- otc for eyes.    [provider]  tamsulosin (FLOMAX) 0.4 MG CAPS capsule Take by mouth.    [provider]  Zoster Vaccine Adjuvanted [facility]) injection 0.5mg  IM now and repeat in 2-6 months. 06/14/21   Debbrah Alar, NP    Allergies    Atorvastatin  Review of Systems   Review of Systems   Constitutional:  Negative for chills and fever.  HENT:  Negative for ear pain and sore throat.   Eyes:  Negative for pain and visual disturbance.  Respiratory:  Negative for cough and shortness of breath.   Cardiovascular:  Negative for chest pain and palpitations.  Gastrointestinal:  Negative for abdominal pain and vomiting.  Genitourinary:  Negative for dysuria and hematuria.  Musculoskeletal:  Negative for arthralgias and back pain.  Skin:  Negative for color change and rash.  Neurological:  Positive for dizziness. Negative for seizures and syncope.  All other systems reviewed and are negative.  Physical Exam Updated Vital Signs BP (!) 177/99   Pulse 62   Temp 98 F (36.7 C) (Oral)   Resp 16   Ht 6' (1.829 m)   Wt 81.6 kg   SpO2 100%   BMI 24.41 kg/m   Physical Exam Vitals and nursing note reviewed.  Constitutional:      Appearance: He is well-developed.  HENT:     Head: Normocephalic and atraumatic.     Right Ear: Tympanic membrane normal.     Left Ear: Tympanic membrane normal.     Mouth/Throat:     Mouth: Mucous membranes are moist.  Eyes:     Conjunctiva/sclera: Conjunctivae normal.  Cardiovascular:     Rate and Rhythm: Normal rate and regular rhythm.     Heart sounds: No murmur heard. Pulmonary:     Effort: Pulmonary effort is normal. No respiratory distress.     Breath sounds: Normal breath sounds.  Abdominal:     Palpations: Abdomen is soft.     Tenderness: There is no abdominal tenderness.  Musculoskeletal:        General: No deformity or signs of injury.     Cervical back: Neck supple.  Skin:    General: Skin is warm and dry.  Neurological:     Mental Status: He is alert.     Comments: AAOx3 CN 2-12 intact, speech clear visual fields intact 5/5 strength in b/l UE and LE Sensation to light touch intact in b/l UE and LE Normal FNF Normal gait    ED Results / Procedures / Treatments   Labs (all labs ordered are listed, but only abnormal  results are displayed) Labs Reviewed  BASIC METABOLIC PANEL - Abnormal; Notable for the following components:      Result Value   Glucose, Bld 133 (*)    All other components within normal limits  URINALYSIS, ROUTINE W REFLEX MICROSCOPIC - Abnormal; Notable for the following components:   Hgb urine dipstick SMALL (*)  All other components within normal limits  URINALYSIS, MICROSCOPIC (REFLEX) - Abnormal; Notable for the following components:   Bacteria, UA RARE (*)    All other components within normal limits  CBG MONITORING, ED - Abnormal; Notable for the following components:   Glucose-Capillary 128 (*)    All other components within normal limits  CBC  TROPONIN I (HIGH SENSITIVITY)  TROPONIN I (HIGH SENSITIVITY)    EKG EKG Interpretation  Date/Time:  Thursday August 31 2021 17:20:10 EDT Ventricular Rate:  74 PR Interval:  148 QRS Duration: 82 QT Interval:  398 QTC Calculation: 441 R Axis:   64 Text Interpretation: Normal sinus rhythm Normal ECG Confirmed by Madalyn Rob [id number]) on 09/01/2021 1:09:07 PM  Radiology CT Angio Head W or Wo Contrast  Result Date: 08/31/2021 CLINICAL DATA:  Vertigo, central EXAM: CT ANGIOGRAPHY HEAD AND NECK TECHNIQUE: Multidetector CT imaging of the head and neck was performed using the standard protocol during bolus administration of intravenous contrast. Multiplanar CT image reconstructions and MIPs were obtained to evaluate the vascular anatomy. Carotid stenosis measurements (when applicable) are obtained utilizing NASCET criteria, using the distal internal carotid diameter as the denominator. CONTRAST:  40mL OMNIPAQUE IOHEXOL 350 MG/ML SOLN COMPARISON:  None. FINDINGS: CT HEAD Brain: There is no acute intracranial hemorrhage, mass effect, or edema. Gray-white differentiation is preserved. There is no extra-axial fluid collection. Ventricles and sulci are within normal limits in size and configuration. Vascular: No hyperdense vessel. Skull:  Calvarium is unremarkable. Sinuses/Orbits: No acute finding. Other: None. Review of the MIP images confirms the above findings CTA NECK Aortic arch: Great vessel origins are patent. Right carotid system: Patent. Trace calcified plaque at the ICA origin without stenosis. Left carotid system: Patent. Minimal calcified plaque at the ICA origin without stenosis. Vertebral arteries: Patent.  Left vertebral artery is dominant. Skeleton: Degenerative changes of the included spine. Other neck: Unremarkable. Upper chest: Included lungs are clear. Review of the MIP images confirms the above findings CTA HEAD Anterior circulation: Intracranial internal carotid arteries are patent. Anterior cerebral arteries are patent. Anterior communicating artery is present. Middle cerebral arteries are patent. Posterior circulation: Intracranial vertebral arteries are patent. Basilar artery is patent. Major cerebellar artery origins are patent. Left posterior communicating artery is present. Posterior cerebral arteries are patent. Venous sinuses: Patent as allowed by contrast bolus timing. Review of the MIP images confirms the above findings IMPRESSION: No acute intracranial abnormality. No large vessel occlusion, hemodynamically significant stenosis, or evidence of dissection. Electronically Signed   By: Macy Mis M.D.   On: 08/31/2021 20:05   CT Angio Neck W and/or Wo Contrast  Result Date: 08/31/2021 CLINICAL DATA:  Vertigo, central EXAM: CT ANGIOGRAPHY HEAD AND NECK TECHNIQUE: Multidetector CT imaging of the head and neck was performed using the standard protocol during bolus administration of intravenous contrast. Multiplanar CT image reconstructions and MIPs were obtained to evaluate the vascular anatomy. Carotid stenosis measurements (when applicable) are obtained utilizing NASCET criteria, using the distal internal carotid diameter as the denominator. CONTRAST:  19mL OMNIPAQUE IOHEXOL 350 MG/ML SOLN COMPARISON:  None.  FINDINGS: CT HEAD Brain: There is no acute intracranial hemorrhage, mass effect, or edema. Gray-white differentiation is preserved. There is no extra-axial fluid collection. Ventricles and sulci are within normal limits in size and configuration. Vascular: No hyperdense vessel. Skull: Calvarium is unremarkable. Sinuses/Orbits: No acute finding. Other: None. Review of the MIP images confirms the above findings CTA NECK Aortic arch: Great vessel origins are patent. Right carotid system: Patent.  Trace calcified plaque at the ICA origin without stenosis. Left carotid system: Patent. Minimal calcified plaque at the ICA origin without stenosis. Vertebral arteries: Patent.  Left vertebral artery is dominant. Skeleton: Degenerative changes of the included spine. Other neck: Unremarkable. Upper chest: Included lungs are clear. Review of the MIP images confirms the above findings CTA HEAD Anterior circulation: Intracranial internal carotid arteries are patent. Anterior cerebral arteries are patent. Anterior communicating artery is present. Middle cerebral arteries are patent. Posterior circulation: Intracranial vertebral arteries are patent. Basilar artery is patent. Major cerebellar artery origins are patent. Left posterior communicating artery is present. Posterior cerebral arteries are patent. Venous sinuses: Patent as allowed by contrast bolus timing. Review of the MIP images confirms the above findings IMPRESSION: No acute intracranial abnormality. No large vessel occlusion, hemodynamically significant stenosis, or evidence of dissection. Electronically Signed   By: Macy Mis M.D.   On: 08/31/2021 20:05    Procedures Procedures   Medications Ordered in ED Medications  meclizine (ANTIVERT) tablet 25 mg (25 mg Oral Given 08/31/21 1743)  sodium chloride 0.9 % bolus 1,000 mL (0 mLs Intravenous Stopped 08/31/21 2103)  iohexol (OMNIPAQUE) 350 MG/ML injection 75 mL (75 mLs Intravenous Contrast Given 08/31/21 1942)     ED Course  I have reviewed the triage vital signs and the nursing notes.  Pertinent labs & imaging results that were available during my care of the patient were reviewed by me and considered in my medical decision making (see chart for details).    MDM Rules/Calculators/A&P                            70 year old male presents to ER with concern for dizziness.  Describes room spinning sensation.  On physical exam he appears well in no distress.  Given age, sudden onset of symptoms, checked basic labs including troponin as well as EKG and CTA head and neck.  Provided dose of meclizine and some fluids in case of possible dehydration.  His blood work was all grossly within normal limits.  EKG without acute ischemic change and troponin within normal limits.  CTA head/neck is negative for dissection, stroke.  On reassessment, patient had complete resolution of the dizziness and he was able to ambulate in department without any difficulty.  Given the reassuring work-up and his reassuring physical exam at present, have a very low suspicion for central cause of his vertigo.  Suspect more likely peripheral.  Given no ongoing dizziness, believe he can be discharged and managed further in the outpatient setting.  Recommended he follow-up with primary care.  Did discuss limitations of CT imaging and recommended that patient come back to ER if he does have any significant worsening of his dizziness or if he has any development of other new neurologic complaint.  Patient and wife at bedside demonstrated good understanding.  Discharged.  After the discussed management above, the patient was determined to be safe for discharge.  The patient was in agreement with this plan and all questions regarding their care were answered.  ED return precautions were discussed and the patient will return to the ED with any significant worsening of condition.  Final Clinical Impression(s) / ED Diagnoses Final diagnoses:   Dizziness  Benign paroxysmal positional vertigo, unspecified laterality  Hypertension, unspecified type    Rx / DC Orders ED Discharge Orders          Ordered    meclizine (ANTIVERT) 25 MG tablet  3 times daily PRN        08/31/21 2226             Zachary Starch, MD 09/01/21 1312

## 2021-09-01 DIAGNOSIS — H5203 Hypermetropia, bilateral: Secondary | ICD-10-CM | POA: Diagnosis not present

## 2021-09-01 DIAGNOSIS — Z961 Presence of intraocular lens: Secondary | ICD-10-CM | POA: Diagnosis not present

## 2021-09-06 ENCOUNTER — Other Ambulatory Visit: Payer: Self-pay

## 2021-09-06 ENCOUNTER — Ambulatory Visit (INDEPENDENT_AMBULATORY_CARE_PROVIDER_SITE_OTHER): Payer: Medicare Other | Admitting: Family

## 2021-09-06 ENCOUNTER — Encounter: Payer: Self-pay | Admitting: Family

## 2021-09-06 ENCOUNTER — Telehealth: Payer: Self-pay | Admitting: Family

## 2021-09-06 VITALS — BP 153/88 | HR 60 | Temp 98.5°F | Resp 16 | Ht 72.0 in | Wt 182.0 lb

## 2021-09-06 DIAGNOSIS — R42 Dizziness and giddiness: Secondary | ICD-10-CM | POA: Diagnosis not present

## 2021-09-06 DIAGNOSIS — R739 Hyperglycemia, unspecified: Secondary | ICD-10-CM

## 2021-09-06 DIAGNOSIS — I951 Orthostatic hypotension: Secondary | ICD-10-CM

## 2021-09-06 LAB — BASIC METABOLIC PANEL
BUN: 22 mg/dL (ref 6–23)
CO2: 30 mEq/L (ref 19–32)
Calcium: 9.2 mg/dL (ref 8.4–10.5)
Chloride: 104 mEq/L (ref 96–112)
Creatinine, Ser: 1.01 mg/dL (ref 0.40–1.50)
GFR: 75.26 mL/min (ref >60.00)
Glucose, Bld: 83 mg/dL (ref 70–99)
Potassium: 4 mEq/L (ref 3.5–5.1)
Sodium: 141 mEq/L (ref 135–145)

## 2021-09-06 LAB — HEMOGLOBIN A1C: Hgb A1c MFr Bld: 5.5 % (ref 4.6–6.5)

## 2021-09-06 NOTE — Progress Notes (Signed)
Subjective:   By signing my name below, I, Zachary Lawrence, attest that this documentation has been prepared under the direction and in the presence of Debbrah Alar NP. 09/06/2021    Patient ID: Zachary Bard., male    DOB: June 09, 1951, 70 y.o.   MRN: [id number]  Chief Complaint  Patient presents with   Follow-up    Follow up after er visit    HPI Patient is in today for a ER follow up.   He was admitted to the ED on 08/31/2021 for Benign paroxysmal positional vertigo, and hypertension. He was given 25 mg meclizine 3x daily PRN to manage his symptoms on discharge. He was discharged on 08/31/2021.   He reports Thursday , 08/31/2021, afternoon he laid down in his room to complete sit ups but had an episode of dizziness while laying down flat on the floor. He stayed disoriented after getting up and walking around. He then took a showed and drove himself to he emergency room.  He is not taking 25 mg meclizine 3x daily PRN at this time due to feeling bad while taking it. He continues having episodes of dizziness and notes having them while lifting weights in the gym. He reports his blood pressure is low while having these episodes. He has not had any episodes of dizziness since his ER visit, but he feels off since then.   Blood sugar- His blood sugar was elevated in the ER during his episode of dizziness. He reports not eating prior to that incident.  Flomax- He is no longer taking Flomax at this time.  Blood pressure- He is taking 0.1 fludrocortisone daily PO by cardiology. His cardiologist has discussed wearing compression socks to help manage his blood pressure dropping but he declined wearing them. He is interested in wearing knee high compression socks during this visit.  BP Readings from Last 3 Encounters:  09/06/21 (!) 153/88  08/31/21 (!) 177/99  08/25/21 124/82   Pulse Readings from Last 3 Encounters:  09/06/21 60  08/31/21 62  08/25/21 65   Immunizations- He is not  interested in getting a flu vaccine.   Health Maintenance Due  Topic Date Due   Zoster Vaccines- Shingrix (1 of 2) Never done    Past Medical History:  Diagnosis Date   GERD (gastroesophageal reflux disease)    History of hepatitis C    s/p curative therapy in the 90's.     Orthostatic hypotension 06/15/2021   Overactive bladder     Past Surgical History:  Procedure Laterality Date   CHOLECYSTECTOMY  1992   TONSILLECTOMY      Family History  Problem Relation Age of Onset   CAD Father 60       CABG/Stent   Heart disease Father        CAD with CABG and stent   Ovarian cancer Sister    Alzheimer's disease Mother    Heart disease Paternal Uncle    Cancer Paternal Uncle        prostate cancer    Social History   Socioeconomic History   Marital status: Divorced    Spouse name: Not on file   Number of children: 2   Years of education: Not on file   Highest education level: Not on file  Occupational History   Occupation: realtor  Tobacco Use   Smoking status: Former    Types: Cigarettes    Quit date: 12/11/1975    Years since quitting: 45.7   Smokeless  tobacco: Never  Vaping Use   Vaping Use: Never used  Substance and Sexual Activity   Alcohol use: Yes    Comment: social   Drug use: No   Sexual activity: Yes  Other Topics Concern   Not on file  Social History Narrative   Lives alone.     Social Determinants of Health   Financial Resource Strain: Not on file  Food Insecurity: Not on file  Transportation Needs: Not on file  Physical Activity: Not on file  Stress: Not on file  Social Connections: Not on file  Intimate Partner Violence: Not on file    Outpatient Medications Prior to Visit  Medication Sig Dispense Refill   aspirin EC 325 MG tablet Take 325 mg by mouth as needed for mild pain.     meclizine (ANTIVERT) 25 MG tablet Take 1 tablet (25 mg total) by mouth 3 (three) times daily as needed for dizziness. 30 tablet 0   meloxicam (MOBIC) 7.5 MG  tablet Take 1 tablet (7.5 mg total) by mouth 2 (two) times daily as needed for pain. 30 tablet 2   OVER THE COUNTER MEDICATION Aerie's- otc for eyes.     B Complex Vitamins (VITAMIN-B COMPLEX PO) Take by mouth.     chlorhexidine (HIBICLENS) 4 % external liquid 2-3times a week x 2weeks, then once a week as needed 120 mL 0   fludrocortisone (FLORINEF) 0.1 MG tablet Take 2 tablets (0.2 mg total) by mouth daily. 60 tablet 3   FOLIC ACID PO Take by mouth.     nitroGLYCERIN (NITROSTAT) 0.4 MG SL tablet Place 1 tablet (0.4 mg total) under the tongue every 5 (five) minutes as needed for chest pain. Do not use more than 4tabs 20 tablet 0   Omega-3 Fatty Acids (OMEGA 3 PO) Take 1 tablet by mouth daily.     tamsulosin (FLOMAX) 0.4 MG CAPS capsule Take by mouth.     Zoster Vaccine Adjuvanted [facility]) injection 0.5mg  IM now and repeat in 2-6 months. 0.5 mL 1   No facility-administered medications prior to visit.    Allergies  Allergen Reactions   Atorvastatin Other (See Comments)    Muscle and joint pain.    Review of Systems  Neurological:  Positive for dizziness.      Objective:    Physical Exam Constitutional:      General: He is not in acute distress.    Appearance: Normal appearance. He is not ill-appearing.  HENT:     Head: Normocephalic and atraumatic.     Right Ear: External ear normal.     Left Ear: External ear normal.  Eyes:     Extraocular Movements: Extraocular movements intact.     Pupils: Pupils are equal, round, and reactive to light.  Cardiovascular:     Rate and Rhythm: Normal rate and regular rhythm.     Heart sounds: Normal heart sounds. No murmur heard.   No gallop.  Pulmonary:     Effort: Pulmonary effort is normal. No respiratory distress.     Breath sounds: Normal breath sounds. No wheezing or rales.  Skin:    General: Skin is warm and dry.  Neurological:     Mental Status: He is alert and oriented to person, place, and time.     Comments: Mildly positive  Dix-Hallpike exam bilateral  Psychiatric:        Behavior: Behavior normal.        Judgment: Judgment normal.    BP (!) 153/88 (BP  Location: Right Arm, Patient Position: Sitting, Cuff Size: Small)   Pulse 60   Temp 98.5 F (36.9 C) (Oral)   Resp 16   Ht 6' (1.829 m)   Wt 182 lb (82.6 kg)   SpO2 100%   BMI 24.68 kg/m  Wt Readings from Last 3 Encounters:  09/06/21 182 lb (82.6 kg)  08/31/21 180 lb (81.6 kg)  07/14/21 176 lb 14.4 oz (80.2 kg)       Assessment & Plan:   Problem List Items Addressed This Visit       Unprioritized   Vertigo    I think that the episode that he had prompting his ER visit was due to vertigo and not related to his orthostatic hypotension.  Very mild symptoms on exam today. Discussed that should symptoms return, we would plan referral to vestibular PT.        Orthostatic hypotension    Patient has seen some improvement in the frequency/severity of his orthostatic episodes since beginning fluorinef, but they continue to limit his daily activities and he is frustrated by this.  I would like to check plasma catacholamines (see phone note).  In addition we discussed importance of a trial of compression stockings.  Discussed that standard practice would be waist high stockings, but he declines this.  He is willing to try knee high compression hose for a few weeks to see if this helps him.   I have given him a written rx for knee high compression hose 20-30 mmHg. I also gave him the number for a local medical device store that measures for compression hose.  Cardiology is managing his fluorinef.       Other Visit Diagnoses     Hyperglycemia    -  Primary   Relevant Orders   Hemoglobin A1c (Completed)   Basic metabolic panel (Completed)        No orders of the defined types were placed in this encounter.  30 minutes spent on today's visit. The majority of the time was spent counseling patient and reviewing records.  I, Debbrah Alar NP,  personally preformed the services described in this documentation.  All medical record entries made by the scribe were at my direction and in my presence.  I have reviewed the chart and discharge instructions (if applicable) and agree that the record reflects my personal performance and is accurate and complete. 09/06/2021   I,Zachary Lawrence,acting as a scribe for Nance Pear, NP.,have documented all relevant documentation on the behalf of Nance Pear, NP,as directed by  Nance Pear, NP while in the presence of Nance Pear, NP.   Nance Pear, NP

## 2021-09-06 NOTE — Patient Instructions (Signed)
Please complete lab work prior to leaving. Try wearing compression stockings daily.

## 2021-09-06 NOTE — Telephone Encounter (Signed)
Hello, Zachary Lawrence was asking if someone from your office would call hi to discuss his blood pressure please. tks

## 2021-09-06 NOTE — Telephone Encounter (Signed)
Called patient and Zachary Lawrence

## 2021-09-07 ENCOUNTER — Telehealth: Payer: Self-pay | Admitting: Family

## 2021-09-07 DIAGNOSIS — I951 Orthostatic hypotension: Secondary | ICD-10-CM

## 2021-09-07 NOTE — Assessment & Plan Note (Signed)
I think that the episode that he had prompting his ER visit was due to vertigo and not related to his orthostatic hypotension.  Very mild symptoms on exam today. Discussed that should symptoms return, we would plan referral to vestibular PT.

## 2021-09-07 NOTE — Telephone Encounter (Signed)
Please advise pt that I was doing some additional research on his condition and I would like to check a hormonal level in the lab at his convenience for further evaluation.

## 2021-09-07 NOTE — Assessment & Plan Note (Signed)
Patient has seen some improvement in the frequency/severity of his orthostatic episodes since beginning fluorinef, but they continue to limit his daily activities and he is frustrated by this.  I would like to check plasma catacholamines (see phone note).  In addition we discussed importance of a trial of compression stockings.  Discussed that standard practice would be waist high stockings, but he declines this.  He is willing to try knee high compression hose for a few weeks to see if this helps him.   I have given him a written rx for knee high compression hose 20-30 mmHg. I also gave him the number for a local medical device store that measures for compression hose.  Cardiology is managing his fluorinef.

## 2021-09-11 NOTE — Telephone Encounter (Signed)
Patient advised of provider's comments and scheduled to be here tomorrow for labs.

## 2021-09-12 ENCOUNTER — Other Ambulatory Visit: Payer: Self-pay

## 2021-09-12 ENCOUNTER — Other Ambulatory Visit (INDEPENDENT_AMBULATORY_CARE_PROVIDER_SITE_OTHER): Payer: Medicare Other

## 2021-09-12 DIAGNOSIS — I951 Orthostatic hypotension: Secondary | ICD-10-CM | POA: Diagnosis not present

## 2021-09-17 LAB — CATECHOLAMINES, FRACTIONATED, PLASMA
Dopamine: <10 pg/mL
Epinephrine: <20 pg/mL
Norepinephrine: 342 pg/mL
Total Catecholamines: 342 pg/mL

## 2021-09-18 ENCOUNTER — Encounter: Payer: Self-pay | Admitting: Family

## 2021-09-18 DIAGNOSIS — I951 Orthostatic hypotension: Secondary | ICD-10-CM

## 2021-10-10 ENCOUNTER — Telehealth: Payer: Self-pay | Admitting: Family

## 2021-10-10 NOTE — Telephone Encounter (Signed)
Thank you, could you please offer that to him?

## 2021-10-10 NOTE — Telephone Encounter (Signed)
-----   Message from Deboraha Sprang, MD sent at 10/09/2021  5:49 PM EDT ----- I would be glad to see him  We have a slot on Wed in [place] if he wants to come there  ----- Message ----- From: Debbrah Alar, NP Sent: 09/19/2021  12:33 PM EDT To: Deboraha Sprang, MD  Dr. Caryl Comes,  This patient follows with me and also with Dr. Gwenlyn Found.  He has been struggling with orthostatic hypotension and is having minimal improvement with fluorinef and support stockings.  He is very frustrated. Would you have any additional testing to offer him if I were to send him for a formal consult with you?   Thanks,  [employer]

## 2021-10-26 ENCOUNTER — Other Ambulatory Visit: Payer: Self-pay

## 2021-10-26 ENCOUNTER — Ambulatory Visit (INDEPENDENT_AMBULATORY_CARE_PROVIDER_SITE_OTHER): Payer: Medicare Other

## 2021-10-26 VITALS — BP 128/70 | HR 84 | Temp 98.1°F | Resp 16 | Ht 72.0 in | Wt 176.2 lb

## 2021-10-26 DIAGNOSIS — Z0000 Encounter for general adult medical examination without abnormal findings: Secondary | ICD-10-CM

## 2021-10-26 NOTE — Patient Instructions (Signed)
[name] , Thank you for taking time to come for your Medicare Wellness Visit. I appreciate your ongoing commitment to your health goals. Please review the following plan we discussed and let me know if I can assist you in the future.   Screening recommendations/referrals: Colonoscopy: Completed 06/09/2018-Due 06/09/2028 Recommended yearly ophthalmology/optometry visit for glaucoma screening and checkup Recommended yearly dental visit for hygiene and checkup  Vaccinations: Influenza vaccine: Declined Pneumococcal vaccine: Up to date Tdap vaccine: Up to date Shingles vaccine: Discuss with pharmacy   Covid-19: Declined  Advanced directives: Please bring a copy of Living Will and/or Healthcare Power of Attorney for your chart.   Conditions/risks identified: See problem list  Next appointment: Follow up in one year for your annual wellness visit. 10/29/2022 @ 10:20  Preventive Care 70 Years and Older, Male Preventive care refers to lifestyle choices and visits with your health care provider that can promote health and wellness. What does preventive care include? A yearly physical exam. This is also called an annual well check. Dental exams once or twice a year. Routine eye exams. Ask your health care provider how often you should have your eyes checked. Personal lifestyle choices, including: Daily care of your teeth and gums. Regular physical activity. Eating a healthy diet. Avoiding tobacco and drug use. Limiting alcohol use. Practicing safe sex. Taking low doses of aspirin every day. Taking vitamin and mineral supplements as recommended by your health care provider. What happens during an annual well check? The services and screenings done by your health care provider during your annual well check will depend on your age, overall health, lifestyle risk factors, and family history of disease. Counseling  Your health care provider may ask you questions about your: Alcohol  use. Tobacco use. Drug use. Emotional well-being. Home and relationship well-being. Sexual activity. Eating habits. History of falls. Memory and ability to understand (cognition). Work and work Statistician. Screening  You may have the following tests or measurements: Height, weight, and BMI. Blood pressure. Lipid and cholesterol levels. These may be checked every 5 years, or more frequently if you are over 70 years old. Skin check. Lung cancer screening. You may have this screening every year starting at age 84 if you have a 30-pack-year history of smoking and currently smoke or have quit within the past 15 years. Fecal occult blood test (FOBT) of the stool. You may have this test every year starting at age 41. Flexible sigmoidoscopy or colonoscopy. You may have a sigmoidoscopy every 5 years or a colonoscopy every 10 years starting at age 50. Prostate cancer screening. Recommendations will vary depending on your family history and other risks. Hepatitis C blood test. Hepatitis B blood test. Sexually transmitted disease (STD) testing. Diabetes screening. This is done by checking your blood sugar (glucose) after you have not eaten for a while (fasting). You may have this done every 1-3 years. Abdominal aortic aneurysm (AAA) screening. You may need this if you are a current or former smoker. Osteoporosis. You may be screened starting at age 47 if you are at high risk. Talk with your health care provider about your test results, treatment options, and if necessary, the need for more tests. Vaccines  Your health care provider may recommend certain vaccines, such as: Influenza vaccine. This is recommended every year. Tetanus, diphtheria, and acellular pertussis (Tdap, Td) vaccine. You may need a Td booster every 10 years. Zoster vaccine. You may need this after age 67. Pneumococcal 13-valent conjugate (PCV13) vaccine. One dose is  recommended after age 27. Pneumococcal polysaccharide  (PPSV23) vaccine. One dose is recommended after age 24. Talk to your health care provider about which screenings and vaccines you need and how often you need them. This information is not intended to replace advice given to you by your health care provider. Make sure you discuss any questions you have with your health care provider. Document Released: 12/23/2015 Document Revised: 08/15/2016 Document Reviewed: 09/27/2015 Elsevier Interactive Patient Education  2017 [employer] Prevention in the Home Falls can cause injuries. They can happen to people of all ages. There are many things you can do to make your home safe and to help prevent falls. What can I do on the outside of my home? Regularly fix the edges of walkways and driveways and fix any cracks. Remove anything that might make you trip as you walk through a door, such as a raised step or threshold. Trim any bushes or trees on the path to your home. Use bright outdoor lighting. Clear any walking paths of anything that might make someone trip, such as rocks or tools. Regularly check to see if handrails are loose or broken. Make sure that both sides of any steps have handrails. Any raised decks and porches should have guardrails on the edges. Have any leaves, snow, or ice cleared regularly. Use sand or salt on walking paths during winter. Clean up any spills in your garage right away. This includes oil or grease spills. What can I do in the bathroom? Use night lights. Install grab bars by the toilet and in the tub and shower. Do not use towel bars as grab bars. Use non-skid mats or decals in the tub or shower. If you need to sit down in the shower, use a plastic, non-slip stool. Keep the floor dry. Clean up any water that spills on the floor as soon as it happens. Remove soap buildup in the tub or shower regularly. Attach bath mats securely with double-sided non-slip rug tape. Do not have throw rugs and other things on the  floor that can make you trip. What can I do in the bedroom? Use night lights. Make sure that you have a light by your bed that is easy to reach. Do not use any sheets or blankets that are too big for your bed. They should not hang down onto the floor. Have a firm chair that has side arms. You can use this for support while you get dressed. Do not have throw rugs and other things on the floor that can make you trip. What can I do in the kitchen? Clean up any spills right away. Avoid walking on wet floors. Keep items that you use a lot in easy-to-reach places. If you need to reach something above you, use a strong step stool that has a grab bar. Keep electrical cords out of the way. Do not use floor polish or wax that makes floors slippery. If you must use wax, use non-skid floor wax. Do not have throw rugs and other things on the floor that can make you trip. What can I do with my stairs? Do not leave any items on the stairs. Make sure that there are handrails on both sides of the stairs and use them. Fix handrails that are broken or loose. Make sure that handrails are as long as the stairways. Check any carpeting to make sure that it is firmly attached to the stairs. Fix any carpet that is loose or worn. Avoid having  throw rugs at the top or bottom of the stairs. If you do have throw rugs, attach them to the floor with carpet tape. Make sure that you have a light switch at the top of the stairs and the bottom of the stairs. If you do not have them, ask someone to add them for you. What else can I do to help prevent falls? Wear shoes that: Do not have high heels. Have rubber bottoms. Are comfortable and fit you well. Are closed at the toe. Do not wear sandals. If you use a stepladder: Make sure that it is fully opened. Do not climb a closed stepladder. Make sure that both sides of the stepladder are locked into place. Ask someone to hold it for you, if possible. Clearly mark and make  sure that you can see: Any grab bars or handrails. First and last steps. Where the edge of each step is. Use tools that help you move around (mobility aids) if they are needed. These include: Canes. Walkers. Scooters. Crutches. Turn on the lights when you go into a dark area. Replace any light bulbs as soon as they burn out. Set up your furniture so you have a clear path. Avoid moving your furniture around. If any of your floors are uneven, fix them. If there are any pets around you, be aware of where they are. Review your medicines with your doctor. Some medicines can make you feel dizzy. This can increase your chance of falling. Ask your doctor what other things that you can do to help prevent falls. This information is not intended to replace advice given to you by your health care provider. Make sure you discuss any questions you have with your health care provider. Document Released: 09/22/2009 Document Revised: 05/03/2016 Document Reviewed: 12/31/2014 Elsevier Interactive Patient Education  2017 [employer].

## 2021-10-26 NOTE — Progress Notes (Signed)
Subjective:   Zachary Lawrence. is a 70 y.o. male who presents for Medicare Annual/Subsequent preventive examination.   Review of Systems     Cardiac Risk Factors include: advanced age (>74men, >33 women);dyslipidemia;male gender     Objective:    Today's Vitals   10/26/21 0933  BP: 128/70  Pulse: 84  Resp: 16  Temp: 98.1 F (36.7 C)  TempSrc: Oral  SpO2: 99%  Weight: 176 lb 3.2 oz (79.9 kg)  Height: 6' (1.829 m)   Body mass index is 23.9 kg/m.  Advanced Directives 10/26/2021 08/31/2021 04/27/2020 02/26/2018 01/26/2015  Does Patient Have a Medical Advance Directive? Yes Yes No No No  Type of Paramedic of [place];Living will - - - -  Copy of [place] in Chart? No - copy requested - - - -  Would patient like information on creating a medical advance directive? - - No - Patient declined Yes (MAU/Ambulatory/Procedural Areas - Information given) No - patient declined information    Current Medications (verified) Outpatient Encounter Medications as of 10/26/2021  Medication Sig   aspirin EC 325 MG tablet Take 325 mg by mouth as needed for mild pain.   meclizine (ANTIVERT) 25 MG tablet Take 1 tablet (25 mg total) by mouth 3 (three) times daily as needed for dizziness.   meloxicam (MOBIC) 7.5 MG tablet Take 1 tablet (7.5 mg total) by mouth 2 (two) times daily as needed for pain.   OVER THE COUNTER MEDICATION Aerie's- otc for eyes. (Patient not taking: Reported on 10/26/2021)   No facility-administered encounter medications on file as of 10/26/2021.    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     Orthostatic hypotension 06/15/2021   Overactive bladder    Past Surgical History:  Procedure Laterality Date   CHOLECYSTECTOMY  1992   TONSILLECTOMY     Family History  Problem  Relation Age of Onset   CAD Father 22       CABG/Stent   Heart disease Father        CAD with CABG and stent   Ovarian cancer Sister    Alzheimer's disease Mother    Heart disease Paternal Uncle    Cancer Paternal Uncle        prostate cancer   Social History   Socioeconomic History   Marital status: Divorced    Spouse name: Not on file   Number of children: 2   Years of education: Not on file   Highest education level: Not on file  Occupational History   Occupation: realtor  Tobacco Use   Smoking status: Former    Types: Cigarettes    Quit date: 12/11/1975    Years since quitting: 45.9   Smokeless tobacco: Never  Vaping Use   Vaping Use: Never used  Substance and Sexual Activity   Alcohol use: Yes    Comment: social   Drug use: No   Sexual activity: Yes  Other Topics Concern   Not on file  Social History Narrative   Lives alone.     Social Determinants of Health   Financial Resource Strain: Low Risk    Difficulty of Paying Living Expenses: Not hard at all  Food Insecurity: No Food Insecurity   Worried About Charity fundraiser in the Last Year: Never true  Ran Out of Food in the Last Year: Never true  Transportation Needs: No Transportation Needs   Lack of Transportation (Medical): No   Lack of Transportation (Non-Medical): No  Physical Activity: Sufficiently Active   Days of Exercise per Week: 3 days   Minutes of Exercise per Session: 60 min  Stress: No Stress Concern Present   Feeling of Stress : Not at all  Social Connections: Moderately Integrated   Frequency of Communication with Friends and Family: More than three times a week   Frequency of Social Gatherings with Friends and Family: More than three times a week   Attends Religious Services: 1 to 4 times per year   Active Member of [employer] or Organizations: Yes   Attends Archivist Meetings: 1 to 4 times per year   Marital Status: Divorced    Tobacco Counseling Counseling given: Not  Answered   Clinical Intake:  Pre-visit preparation completed: No        BMI - recorded: 23.9 Nutritional Status: BMI of 19-24  Normal Nutritional Risks: None Diabetes: No  How often do you need to have someone help you when you read instructions, pamphlets, or other written materials from your doctor or pharmacy?: 1 - Never  Diabetic?No  Interpreter Needed?: No  Information entered by :: Caroleen Hamman LPN   Activities of Daily Living In your present state of health, do you have any difficulty performing the following activities: 10/26/2021 06/14/2021  Hearing? N N  Vision? N N  Difficulty concentrating or making decisions? N N  Walking or climbing stairs? N N  Dressing or bathing? N N  Doing errands, shopping? N N  Preparing Food and eating ? N -  Using the Toilet? N -  In the past six months, have you accidently leaked urine? N -  Do you have problems with loss of bowel control? N -  Managing your Medications? N -  Managing your Finances? N -  Housekeeping or managing your Housekeeping? N -  Some recent data might be hidden    Patient Care Team: Debbrah Alar, NP as PCP - General (Internal Medicine) Ronald Lobo, MD as Consulting Physician (Gastroenterology)  Indicate any recent Medical Services you may have received from other than [facility] providers in the past year (date may be approximate).     Assessment:   This is a routine wellness examination for [facility].  Hearing/Vision screen Hearing Screening - Comments:: No issues Vision Screening - Comments:: Reading glasses Last eye exam-08/2021-[facility]  Dietary issues and exercise activities discussed: Current Exercise Habits: Home exercise routine, Type of exercise: strength training/weights, Time (Minutes): 60, Frequency (Times/Week): 3, Weekly Exercise (Minutes/Week): 180, Intensity: Mild, Exercise limited by: Other - see comments (frequent dizziness)   Goals Addressed                This Visit's Progress     Patient Stated     Maintain healthy active lifestyle. (pt-stated)   On track     Other     COMPLETED: Complete colonoscopy        DIET - INCREASE WATER INTAKE   Not on track      Depression Screen PHQ 2/9 Scores 10/26/2021 06/14/2021 04/27/2020 01/14/2019 02/26/2018 11/15/2017  PHQ - 2 Score 0 0 0 0 0 0    Fall Risk Fall Risk  10/26/2021 06/14/2021 04/27/2020 01/14/2019 02/26/2018  Falls in the past year? 0 0 0 0 No  Number falls in past yr: 0 0 0 - -  Injury with Fall? 0 0 0 - -  Follow up Falls prevention discussed - Education provided;Falls prevention discussed - -    FALL RISK PREVENTION PERTAINING TO THE HOME:  Any stairs in or around the home? Yes  If so, are there any without handrails? No  Home free of loose throw rugs in walkways, pet beds, electrical cords, etc? Yes  Adequate lighting in your home to reduce risk of falls? Yes   ASSISTIVE DEVICES UTILIZED TO PREVENT FALLS:  Life alert? No  Use of a cane, walker or w/c? No  Grab bars in the bathroom? No  Shower chair or bench in shower? Yes  Elevated toilet seat or a handicapped toilet? Yes elevated seat  TIMED UP AND GO:  Was the test performed? Yes .  Length of time to ambulate 10 feet: 10 sec.   Gait steady and fast without use of assistive device  Cognitive Function:Normal cognitive status assessed by direct observation by this Nurse Health Advisor. No abnormalities found.          Immunizations Immunization History  Administered Date(s) Administered   PNEUMOCOCCAL CONJUGATE-20 06/14/2021   Tdap 11/28/2009, 04/18/2018    TDAP status: Up to date  Flu Vaccine status: Declined, Education has been provided regarding the importance of this vaccine but patient still declined. Advised may receive this vaccine at local pharmacy or Health Dept. Aware to provide a copy of the vaccination record if obtained from local pharmacy or Health Dept. Verbalized acceptance and  understanding.  Pneumococcal vaccine status: Up to date  Covid-19 vaccine status: Declined, Education has been provided regarding the importance of this vaccine but patient still declined. Advised may receive this vaccine at local pharmacy or Health Dept.or vaccine clinic. Aware to provide a copy of the vaccination record if obtained from local pharmacy or Health Dept. Verbalized acceptance and understanding.  Qualifies for Shingles Vaccine? Yes   Zostavax completed No   Shingrix Completed?: No.    Education has been provided regarding the importance of this vaccine. Patient has been advised to call insurance company to determine out of pocket expense if they have not yet received this vaccine. Advised may also receive vaccine at local pharmacy or Health Dept. Verbalized acceptance and understanding.  Screening Tests Health Maintenance  Topic Date Due   Zoster Vaccines- Shingrix (1 of 2) Never done   TETANUS/TDAP  04/18/2028   COLONOSCOPY (Pts 45-36yrs Insurance coverage will need to be confirmed)  06/09/2028   Pneumonia Vaccine 80+ Years old  Completed   Hepatitis C Screening  Completed   HPV VACCINES  Aged Out   INFLUENZA VACCINE  Discontinued   COVID-19 Vaccine  Discontinued    Health Maintenance  Health Maintenance Due  Topic Date Due   Zoster Vaccines- Shingrix (1 of 2) Never done    Colorectal cancer screening: Type of screening: Colonoscopy. Completed 06/09/2018. Repeat every 10 years  Lung Cancer Screening: (Low Dose CT Chest recommended if Age 26-80 years, 30 pack-year currently smoking OR have quit w/in 15years.) does not qualify.     Additional Screening:  Hepatitis C Screening: Completed 11/21/2020  Vision Screening: Recommended annual ophthalmology exams for early detection of glaucoma and other disorders of the eye. Is the patient up to date with their annual eye exam?  Yes  Who is the provider or what is the name of the office in which the patient attends annual  eye exams? [facility]   Dental Screening: Recommended annual dental exams for proper oral  hygiene  Community Resource Referral / Chronic Care Management: CRR required this visit?  No   CCM required this visit?  No      Plan:     I have personally reviewed and noted the following in the patient's chart:   Medical and social history Use of alcohol, tobacco or illicit drugs  Current medications and supplements including opioid prescriptions. Patient is not currently taking opioid prescriptions. Functional ability and status Nutritional status Physical activity Advanced directives List of other physicians Hospitalizations, surgeries, and ER visits in previous 12 months Vitals Screenings to include cognitive, depression, and falls Referrals and appointments  In addition, I have reviewed and discussed with patient certain preventive protocols, quality metrics, and best practice recommendations. A written personalized care plan for preventive services as well as general preventive health recommendations were provided to patient.    Patient to access avs on mychart.  Marta Antu, LPN   [id number]  Nurse Health Advisor  Nurse Notes: None

## 2021-11-06 ENCOUNTER — Institutional Professional Consult (permissible substitution): Payer: Medicare Other | Admitting: Internal Medicine

## 2021-11-06 DIAGNOSIS — I951 Orthostatic hypotension: Secondary | ICD-10-CM

## 2021-11-21 ENCOUNTER — Ambulatory Visit (INDEPENDENT_AMBULATORY_CARE_PROVIDER_SITE_OTHER): Payer: Medicare Other | Admitting: Internal Medicine

## 2021-11-21 ENCOUNTER — Other Ambulatory Visit: Payer: Self-pay

## 2021-11-21 VITALS — BP 110/60 | HR 57 | Ht 72.0 in | Wt 178.0 lb

## 2021-11-21 DIAGNOSIS — I951 Orthostatic hypotension: Secondary | ICD-10-CM

## 2021-11-21 NOTE — Progress Notes (Signed)
HPI [name] is referred today by Dr. Gwenlyn Found for evaluation of orthostasis. He is a pleasant 70 yo man with non-obstructive CAD who has had problems with dizziness when he stands for a period of time. He has not passed out completely but has had several occaisions where he had to take a knee to avoid falling out. He admits to missing an occaisional meal. He denies other symptoms. He is frustrated as he does not understand what is causing his symptoms.  Allergies  Allergen Reactions   Atorvastatin Other (See Comments)    Muscle and joint pain.     Current Outpatient Medications  Medication Sig Dispense Refill   aspirin EC 325 MG tablet Take 325 mg by mouth as needed for mild pain. As directed     meclizine (ANTIVERT) 25 MG tablet Take 1 tablet (25 mg total) by mouth 3 (three) times daily as needed for dizziness. 30 tablet 0   meloxicam (MOBIC) 7.5 MG tablet Take 1 tablet (7.5 mg total) by mouth 2 (two) times daily as needed for pain. 30 tablet 2   OVER THE COUNTER MEDICATION as directed. Aerie's- otc for eyes.     No current facility-administered medications for this visit.     Past Medical History:  Diagnosis Date   GERD (gastroesophageal reflux disease)    History of hepatitis C    s/p curative therapy in the 90's.     Orthostatic hypotension 06/15/2021   Overactive bladder     ROS:   All systems reviewed and negative except as noted in the HPI.   Past Surgical History:  Procedure Laterality Date   CHOLECYSTECTOMY  1992   TONSILLECTOMY       Family History  Problem Relation Age of Onset   CAD Father 82       CABG/Stent   Heart disease Father        CAD with CABG and stent   Ovarian cancer Sister    Alzheimer's disease Mother    Heart disease Paternal Uncle    Cancer Paternal Uncle        prostate cancer     Social History   Socioeconomic History   Marital status: Divorced    Spouse name: Not on file   Number of children: 2   Years of education:  Not on file   Highest education level: Not on file  Occupational History   Occupation: realtor  Tobacco Use   Smoking status: Former    Types: Cigarettes    Quit date: 12/11/1975    Years since quitting: 45.9   Smokeless tobacco: Never  Vaping Use   Vaping Use: Never used  Substance and Sexual Activity   Alcohol use: Yes    Comment: social   Drug use: No   Sexual activity: Yes  Other Topics Concern   Not on file  Social History Narrative   Lives alone.     Social Determinants of Health   Financial Resource Strain: Low Risk    Difficulty of Paying Living Expenses: Not hard at all  Food Insecurity: No Food Insecurity   Worried About Charity fundraiser in the Last Year: Never true   [place] in the Last Year: Never true  Transportation Needs: No Transportation Needs   Lack of Transportation (Medical): No   Lack of Transportation (Non-Medical): No  Physical Activity: Sufficiently Active   Days of Exercise per Week: 3 days   Minutes of Exercise  per Session: 60 min  Stress: No Stress Concern Present   Feeling of Stress : Not at all  Social Connections: Moderately Integrated   Frequency of Communication with Friends and Family: More than three times a week   Frequency of Social Gatherings with Friends and Family: More than three times a week   Attends Religious Services: 1 to 4 times per year   Active Member of [employer] or Organizations: Yes   Attends Archivist Meetings: 1 to 4 times per year   Marital Status: Divorced  Human resources officer Violence: Not At Risk   Fear of Current or Ex-Partner: No   Emotionally Abused: No   Physically Abused: No   Sexually Abused: No   Orthostatic vitals Lying down BP 124/62, P - 58 Standing 3 min BP 78/50, P - 92  BP 110/60    Pulse (!) 57    Ht 6' (1.829 m)    Wt 178 lb (80.7 kg)    SpO2 98%    BMI 24.14 kg/m   Physical Exam:  Well appearing NAD HEENT: Unremarkable Neck:  No JVD, no thyromegally Lymphatics:  No  adenopathy Back:  No CVA tenderness Lungs:  Clear with no wheezes HEART:  Regular rate rhythm, no murmurs, no rubs, no clicks Abd:  soft, positive bowel sounds, no organomegally, no rebound, no guarding Ext:  2 plus pulses, no edema, no cyanosis, no clubbing Skin:  No rashes no nodules Neuro:  CN II through XII intact, motor grossly intact  EKG - nsr  Assess/Plan:  Orthostasis - I discussed the cause, the physiology and the treatment as well as exacerbating factors. I asked him to limit ETOH and caffeine and eat more salt and drink more fluid. I'll see him back in 3 months. If no better we will discuss different meds. Carleene Overlie Nakenya Theall,MD

## 2021-11-21 NOTE — Patient Instructions (Addendum)
Medication Instructions:  Your physician recommends that you continue on your current medications as directed. Please refer to the Current Medication list given to you today.  Labwork: None ordered.  Testing/Procedures: None ordered.  Follow-Up: Your physician wants you to follow-up in: March 2023 with Cristopher Peru, MD     Any Other Special Instructions Will Be Listed Below (If Applicable).  If you need a refill on your cardiac medications before your next appointment, please call your pharmacy.

## 2021-12-18 DIAGNOSIS — Z85828 Personal history of other malignant neoplasm of skin: Secondary | ICD-10-CM | POA: Diagnosis not present

## 2021-12-18 DIAGNOSIS — L821 Other seborrheic keratosis: Secondary | ICD-10-CM | POA: Diagnosis not present

## 2021-12-18 DIAGNOSIS — Z129 Encounter for screening for malignant neoplasm, site unspecified: Secondary | ICD-10-CM | POA: Diagnosis not present

## 2021-12-18 DIAGNOSIS — L578 Other skin changes due to chronic exposure to nonionizing radiation: Secondary | ICD-10-CM | POA: Diagnosis not present

## 2021-12-18 DIAGNOSIS — L820 Inflamed seborrheic keratosis: Secondary | ICD-10-CM | POA: Diagnosis not present

## 2021-12-18 DIAGNOSIS — L570 Actinic keratosis: Secondary | ICD-10-CM | POA: Diagnosis not present

## 2022-02-02 DIAGNOSIS — R339 Retention of urine, unspecified: Secondary | ICD-10-CM | POA: Diagnosis not present

## 2022-02-02 DIAGNOSIS — N138 Other obstructive and reflux uropathy: Secondary | ICD-10-CM | POA: Diagnosis not present

## 2022-02-02 DIAGNOSIS — N401 Enlarged prostate with lower urinary tract symptoms: Secondary | ICD-10-CM | POA: Diagnosis not present

## 2022-02-09 ENCOUNTER — Other Ambulatory Visit: Payer: Self-pay

## 2022-02-09 ENCOUNTER — Ambulatory Visit (INDEPENDENT_AMBULATORY_CARE_PROVIDER_SITE_OTHER): Payer: Medicare Other | Admitting: Internal Medicine

## 2022-02-09 ENCOUNTER — Encounter: Payer: Self-pay | Admitting: Internal Medicine

## 2022-02-09 VITALS — BP 96/56 | HR 67 | Ht 72.0 in | Wt 181.0 lb

## 2022-02-09 DIAGNOSIS — I951 Orthostatic hypotension: Secondary | ICD-10-CM

## 2022-02-09 DIAGNOSIS — R6889 Other general symptoms and signs: Secondary | ICD-10-CM

## 2022-02-09 NOTE — Patient Instructions (Addendum)
Medication Instructions:  ?Your physician recommends that you continue on your current medications as directed. Please refer to the Current Medication list given to you today. ? ?Labwork: ?None ordered. ? ?Testing/Procedures: ?Your physician has recommended that you have a cardiopulmonary stress test (CPX). CPX testing is a non-invasive measurement of heart and lung function. It replaces a traditional treadmill stress test. This type of test provides a tremendous amount of information that relates not only to your present condition but also for future outcomes. This test combines measurements of you ventilation, respiratory gas exchange in the lungs, electrocardiogram (EKG), blood pressure and physical response before, during, and following an exercise protocol. ? ?Please schedule cardiopulmonary stress test. ? ?Follow-Up: ?Your physician wants you to follow-up:  3 months with DR. Lovena Le. ? ?Any Other Special Instructions Will Be Listed Below (If Applicable). ? ?If you need a refill on your cardiac medications before your next appointment, please call your pharmacy.  ? ? ? ? ?

## 2022-02-09 NOTE — Progress Notes (Signed)
? ? ? ? ?HPI ?Zachary Lawrence returns today for followup. I saw him several weeks ago and thought that his symptoms were most consistent with autonomic dysfunction. He has tried to increase his salt and fluid intake. He has known non-obstructive CAD. He notes that when he exerts himself vigorously he get tired and sob. No anginal symptoms were noted. 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 [place].  ?Allergies  ?Allergen Reactions  ? Atorvastatin Other (See Comments)  ?  Muscle and joint pain.  ? ? ? ?Current Outpatient Medications  ?Medication Sig Dispense Refill  ? aspirin EC 325 MG tablet Take 325 mg by mouth as needed for mild pain. As directed    ? meclizine (ANTIVERT) 25 MG tablet Take 1 tablet (25 mg total) by mouth 3 (three) times daily as needed for dizziness. 30 tablet 0  ? meloxicam (MOBIC) 7.5 MG tablet Take 1 tablet (7.5 mg total) by mouth 2 (two) times daily as needed for pain. 30 tablet 2  ? OVER THE COUNTER MEDICATION as directed. Aerie's- otc for eyes.    ? ?No current facility-administered medications for this visit.  ? ? ? ?Past Medical History:  ?Diagnosis Date  ? GERD (gastroesophageal reflux disease)   ? History of hepatitis C   ? 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    ? Orthostatic hypotension 06/15/2021  ? Overactive bladder   ? ? ?ROS: ? ? All systems reviewed and negative except as noted in the HPI. ? ? ?Past Surgical History:  ?Procedure Laterality Date  ? CHOLECYSTECTOMY  1992  ? TONSILLECTOMY    ? ? ? ?Family History  ?Problem Relation Age of Onset  ? CAD Father 8  ?     CABG/Stent  ? Heart disease Father   ?     CAD with CABG and stent  ? Ovarian cancer Sister   ? Alzheimer's disease Mother   ? Heart disease Paternal Uncle   ? Cancer Paternal Uncle   ?     prostate cancer  ? ? ? ?Social History  ? ?Socioeconomic History  ? Marital status: Divorced  ?  Spouse name: Not on file  ? Number of children: 2  ? Years of education: Not on  file  ? Highest education level: Not on file  ?Occupational History  ? Occupation: realtor  ?Tobacco Use  ? Smoking status: Former  ?  Types: Cigarettes  ?  Quit date: 12/11/1975  ?  Years since quitting: 46.1  ? Smokeless tobacco: Never  ?Vaping Use  ? Vaping Use: Never used  ?Substance and Sexual Activity  ? Alcohol use: Yes  ?  Comment: social  ? Drug use: No  ? Sexual activity: Yes  ?Other Topics Concern  ? Not on file  ?Social History Narrative  ? Lives alone.    ? ?Social Determinants of Health  ? ?Financial Resource Strain: Low Risk   ? Difficulty of Paying Living Expenses: Not hard at all  ?Food Insecurity: No Food Insecurity  ? Worried About Charity fundraiser in the Last Year: Never true  ? Ran Out of Food in the Last Year: Never true  ?Transportation Needs: No Transportation Needs  ? Lack of Transportation (Medical): No  ? Lack of Transportation (Non-Medical): No  ?Physical Activity: Sufficiently Active  ? Days of Exercise per Week: 3 days  ? Minutes of Exercise per Session: 60 min  ?Stress:  No Stress Concern Present  ? Feeling of Stress : Not at all  ?Social Connections: Moderately Integrated  ? Frequency of Communication with Friends and Family: More than three times a week  ? Frequency of Social Gatherings with Friends and Family: More than three times a week  ? Attends Religious Services: 1 to 4 times per year  ? Active Member of Clubs or Organizations: Yes  ? Attends Archivist Meetings: 1 to 4 times per year  ? Marital Status: Divorced  ?Intimate Partner Violence: Not At Risk  ? Fear of Current or Ex-Partner: No  ? Emotionally Abused: No  ? Physically Abused: No  ? Sexually Abused: No  ? ? ? ?BP (!) 96/56   Pulse 67   Ht 6' (1.829 m)   Wt 181 lb (82.1 kg)   SpO2 98%   BMI 24.55 kg/m?  ? ?Physical Exam: ? ?Well appearing NAD ?HEENT: Unremarkable ?Neck:  No JVD, no thyromegally ?Lymphatics:  No adenopathy ?Back:  No CVA tenderness ?Lungs:  Clear with no wheezes ?HEART:  Regular rate  rhythm, no murmurs, no rubs, no clicks ?Abd:  soft, positive bowel sounds, no organomegally, no rebound, no guarding ?Ext:  2 plus pulses, no edema, no cyanosis, no clubbing ?Skin:  No rashes no nodules ?Neuro:  CN II through XII intact, motor grossly intact ? ? ?Assess/Plan:  ?Unexplained dyspnea/fatigue/sob - we walked in the hall today. His oxygen sat did not drop but his HR did not increase. It was 70 at the start of exercise and 63 when we finished. Unclear if the pulse ox was accurate. The patient's symptoms are unclear as to there etiology and I have recommended CPX testing. We will re-assess when we get the results.  ?Probable autonomic dysfunction - maybe a little better but still somewhat symptomatic. We will follow. ? ?Zachary Overlie Latasha Puskas,MD ?

## 2022-02-19 DIAGNOSIS — U071 COVID-19: Secondary | ICD-10-CM | POA: Diagnosis not present

## 2022-02-20 ENCOUNTER — Encounter: Payer: Self-pay | Admitting: Family Medicine

## 2022-02-20 ENCOUNTER — Other Ambulatory Visit: Payer: Self-pay

## 2022-02-20 ENCOUNTER — Encounter (HOSPITAL_COMMUNITY): Payer: Medicare Other

## 2022-02-20 ENCOUNTER — Telehealth: Payer: Self-pay

## 2022-02-20 ENCOUNTER — Telehealth (INDEPENDENT_AMBULATORY_CARE_PROVIDER_SITE_OTHER): Payer: Medicare Other | Admitting: Family Medicine

## 2022-02-20 DIAGNOSIS — R0981 Nasal congestion: Secondary | ICD-10-CM

## 2022-02-20 DIAGNOSIS — U071 COVID-19: Secondary | ICD-10-CM | POA: Diagnosis not present

## 2022-02-20 DIAGNOSIS — Z28310 Unvaccinated for covid-19: Secondary | ICD-10-CM | POA: Diagnosis not present

## 2022-02-20 DIAGNOSIS — R051 Acute cough: Secondary | ICD-10-CM | POA: Diagnosis not present

## 2022-02-20 MED ORDER — FLUTICASONE PROPIONATE 50 MCG/ACT NA SUSP: 2.0000 | Freq: Every day | NASAL | 0 refills | Status: DC

## 2022-02-20 MED ORDER — BENZONATATE 200 MG PO CAPS: 200.0000 mg | ORAL_CAPSULE | Freq: Two times a day (BID) | ORAL | 0 refills | Status: DC | PRN

## 2022-02-20 NOTE — Progress Notes (Signed)
? ?  Subjective:  ? ? Patient ID: Zachary Lawrence., male    DOB: [birth year], 71 y.o.   MRN: [id number] ? ?HPI ?Chief Complaint  ?Patient presents with  ? Covid Positive  ?  Tested positive 02-19-22  ? Generalized Body Aches  ?  Complains of body aches  ? Headache  ?  Complains of headache  ? Cough  ?  Complains of "mild cough"  ? ?Documentation for virtual audio and video telecommunications through [place] encounter: ? ?The patient was located at home. 2 patient identifiers used.  ?The provider was located in the office. ?The patient did consent to this visit and is aware of possible charges through their insurance for this visit. ? ?The other persons participating in this telemedicine service were none. ?Time spent on call was 15 minutes and in review of previous records >15 minutes total. ? ?This virtual service is not related to other E/M service within previous 7 days. ? ?Complains of fatigue, dry cough with clear sputum, headache, body aches. Feels feverish, no thermometer at home.  ?Onset of symptoms 1-2 days ago. Positive home test last night.  ?Denies chills, dizziness, chest pain, palpitations, shortness of breath, wheezing, abdominal pain, N/V/D.  ? ?Father also positive.  ? ?No hx of lung or cardiovascular disease. Immunocompetent.  ?Does not smoke.  ? ?Unvaccinated for Covid.  ? ?Reports taking Nyquil, Mucinex DM, Tylenol.  ?Slept well last night.  ? ? ? ? ?Review of Systems ?Pertinent positives and negatives in the history of present illness. ? ?   ?Objective:  ? Physical Exam ?There were no vitals taken for this visit. ? ?Alert and oriented, respirations unlabored. Speaking in complete sentences without difficulty.  ? ? ?   ?Assessment & Plan:  ?COVID-19 virus infection ? ?Nasal congestion - Plan: fluticasone (FLONASE) 50 MCG/ACT nasal spray ? ?Acute cough - Plan: benzonatate (TESSALON) 200 MG capsule ? ?Unvaccinated for covid-19 ? ?Day 3 of Covid symptoms with positive home test. Declines antiviral  medication but this was recommended and he is aware he may change his mind before day 5. Discussed symptomatic treatment. Prescribed Flonase inhaler and Tessalon. Discussed hydration, rest, OTC medications, Covid precautions and red flag symptoms. He will call back or go to the urgent care if worsening significantly.  ?Harland Dingwall, NP-C ?

## 2022-02-20 NOTE — Telephone Encounter (Signed)
Nurse Assessment ?Nurse: Arvella Nigh, RN, [place]uber[place] Bas[place] Date/Time (Eastern Time): 02/19/2022 8:59:32 PM ?Confirm and document reason for call. If ?symptomatic, describe symptoms. ?---Caller states he tested positive for COVID and ?would like to know if medication can be sent to ?pharmacy. Unsure of what medication. Caller states he ?is currently experiencing leg and hip discomfort rated ?a 5 out of 10 and cough that started a couple days ago. ?Cough is not consistent and is sometime dry and other ?times he coughs up white sputum. No SOB or Chest ?pain. Unable to take temperature. ?Does the patient have any new or worsening ?symptoms? ---Yes ?Will a triage be completed? ---Yes ?Related visit to physician within the last 2 weeks? ---No ?Does the PT have any chronic conditions? (i.e. ?diabetes, asthma, this includes High risk factors for ?pregnancy, etc.) ?---No ?Is this a behavioral health or substance abuse call? ---No ?Guidelines ?Guideline Title Affirmed Question Affirmed Notes Nurse Date/Time (Eastern ?Time) ?COVID-19 - ?Diagnosed or ?Suspected ?[1] [id number]-[id number] ?diagnosed by ?positive lab test ?(e.g., PCR, rapid ?self-test kit) AND ?Arvella Nigh, RN, [place] 02/19/2022 9:04:52 ?PM ?PLEASE NOTE: All timestamps contained within this report are represented as [place] Standard Time. ?CONFIDENTIALTY NOTICE: This fax transmission is intended only for the addressee. It contains information that is legally privileged, confidential or ?otherwise protected from use or disclosure. If you are not the intended recipient, you are strictly prohibited from reviewing, disclosing, copying using ?or disseminating any of this information or taking any action in reliance on or regarding this information. If you have received this fax in error, please ?notify us immediately by telephone so that we can arrange for its return to [place]. Phone: [phone], Toll-Free: [phone], Fax: [phone] ?Page: 2 of 2 ?Call Id: [id number] ?Guidelines ?Guideline Title  Affirmed Question Affirmed Notes Nurse Date/Time (Eastern ?Time) ?[2] mild symptoms ?(e.g., cough, fever, ?others) AND [8] no ?complications or ?SOB ?Disp. Time (Eastern ?Time) Disposition Final User ?02/19/2022 8:43:22 PM Attempt made - message left Arvella Nigh, RN, Lubertha Basque ?02/19/2022 [place], RN, Lubertha Basque ?Caller Disagree/Comply Comply ?Caller Understands Yes ?PreDisposition Home Care ?Care Advice Given Per Guideline ?HOME CARE: * You should be able to treat this at home. * From what you have told me, your symptoms are mild. That is ?reassuring. REASSURANCE AND EDUCATION - POSITIVE COVID-19 LAB TEST AND MILD SYMPTOMS: * Here's some ?care advice to help you and to help prevent others from getting sick. * Cough: Use cough drops. * Feeling dehydrated: Drink extra ?liquids. If the air in your home is dry, use a humidifier. * Fever: For fever over 101 F (38.3 C), take acetaminophen every 4 to 6 ?hours (Adults 650 mg) OR ibuprofen every 6 to 8 hours (Adults 400 mg). Before taking any medicine, read all the instructions ?on the package. Do not take aspirin unless your doctor has prescribed it for you. * HOME REMEDY - HONEY: This old home ?remedy has been shown to help decrease coughing at night. The adult dosage is 2 teaspoons (10 ml) at bedtime. NO ASPIRIN: * ?Do not use aspirin for treatment of fever or pain. COVID-19 - HOW TO PROTECT OTHERS - WHEN YOU ARE SICK WITH ?COVID-19: * STAY HOME A MINIMUM OF 5 DAYS: People with MILD COVID-19 can STOP HOME ISOLATION AFTER ?5 DAYS if (1) fever has been gone for 24 hours (without using fever medicine) AND (2) symptoms are better. Continue to wear a ?well-fitted mask for a full 10 days when around others. * AVOID TRAVEL: Avoid travel for 10 days  after you tested positive for ?COVID-19. * [place] HANDS OFTEN: Wash hands often with soap and water. After coughing or sneezing are important times. If ?soap and water are not available, use an alcohol-based hand sanitizer  with at least 60% alcohol, covering all surfaces of your hands ?and rubbing them together until they feel dry. Avoid touching your eyes, nose, and mouth with unwashed hands. FAQ - DO I NEED ?SPECIAL MEDICINES TO TREAT COVID-19? * For healthy people with MILD SYMPTOMS, prescription medicines are usually ?not needed. People can treat the symptoms at home using over-the-counter medicines for fever, pain, and cough. CALL BACK IF: * ?Fever over 103 F (39.4 C) * Fever lasts over 3 days * Chest pain or difficulty breathing occurs * You become worse CARE ADVICE ?given per COVID-19 - DIAGNOSED OR SUSPECTED (Adult) guideline ?

## 2022-02-20 NOTE — Assessment & Plan Note (Signed)
Day 3 of Covid symptoms with positive home test. Declines antiviral medication but this was recommended and he is aware he may change his mind before day 5. Discussed symptomatic treatment. Prescribed Flonase inhaler and Tessalon. Discussed hydration, rest, OTC medications, Covid precautions and red flag symptoms. He will call back or go to the urgent care if worsening significantly.  ?

## 2022-02-20 NOTE — Telephone Encounter (Signed)
Patient was seen today on a virtual visit ?

## 2022-02-21 ENCOUNTER — Ambulatory Visit: Payer: Medicare Other | Admitting: Cardiovascular Disease

## 2022-02-23 ENCOUNTER — Emergency Department (HOSPITAL_BASED_OUTPATIENT_CLINIC_OR_DEPARTMENT_OTHER): Payer: Medicare Other

## 2022-02-23 ENCOUNTER — Encounter (HOSPITAL_BASED_OUTPATIENT_CLINIC_OR_DEPARTMENT_OTHER): Payer: Self-pay | Admitting: *Deleted

## 2022-02-23 ENCOUNTER — Emergency Department (HOSPITAL_BASED_OUTPATIENT_CLINIC_OR_DEPARTMENT_OTHER)
Admission: EM | Admit: 2022-02-23 | Discharge: 2022-02-23 | Disposition: A | Discharge status: Home / Self Care | Payer: Medicare Other | Attending: Emergency Medicine | Admitting: Emergency Medicine

## 2022-02-23 ENCOUNTER — Other Ambulatory Visit: Payer: Self-pay

## 2022-02-23 DIAGNOSIS — U071 COVID-19: Secondary | ICD-10-CM

## 2022-02-23 DIAGNOSIS — R079 Chest pain, unspecified: Secondary | ICD-10-CM | POA: Diagnosis not present

## 2022-02-23 DIAGNOSIS — R1084 Generalized abdominal pain: Secondary | ICD-10-CM | POA: Diagnosis not present

## 2022-02-23 DIAGNOSIS — R9431 Abnormal electrocardiogram [ECG] [EKG]: Secondary | ICD-10-CM | POA: Diagnosis not present

## 2022-02-23 DIAGNOSIS — R109 Unspecified abdominal pain: Secondary | ICD-10-CM | POA: Diagnosis not present

## 2022-02-23 LAB — COMPREHENSIVE METABOLIC PANEL
ALT: 18 U/L (ref 0–44)
AST: 28 U/L (ref 15–41)
Albumin: 3.6 g/dL (ref 3.5–5.0)
Alkaline Phosphatase: 46 U/L (ref 38–126)
Anion gap: 8 (ref 5–15)
BUN: 23 mg/dL (ref 8–23)
CO2: 27 mmol/L (ref 22–32)
Calcium: 8.7 mg/dL — ABNORMAL LOW (ref 8.9–10.3)
Chloride: 103 mmol/L (ref 98–111)
Creatinine, Ser: 1.18 mg/dL (ref 0.61–1.24)
GFR, Estimated: >60 mL/min (ref >60)
Glucose, Bld: 115 mg/dL — ABNORMAL HIGH (ref 70–99)
Potassium: 3.9 mmol/L (ref 3.5–5.1)
Sodium: 138 mmol/L (ref 135–145)
Total Bilirubin: 0.9 mg/dL (ref 0.3–1.2)
Total Protein: 6.2 g/dL — ABNORMAL LOW (ref 6.5–8.1)

## 2022-02-23 LAB — CBC WITH DIFFERENTIAL/PLATELET
Abs Immature Granulocytes: 0.03 10*3/uL (ref 0.00–0.07)
Basophils Absolute: 0 10*3/uL (ref 0.0–0.1)
Basophils Relative: 0 %
Eosinophils Absolute: 0 10*3/uL (ref 0.0–0.5)
Eosinophils Relative: 0 %
HCT: 41.3 % (ref 39.0–52.0)
Hemoglobin: 14.6 g/dL (ref 13.0–17.0)
Immature Granulocytes: 0 %
Lymphocytes Relative: 11 %
Lymphs Abs: 0.9 10*3/uL (ref 0.7–4.0)
MCH: 32.3 pg (ref 26.0–34.0)
MCHC: 35.4 g/dL (ref 30.0–36.0)
MCV: 91.4 fL (ref 80.0–100.0)
Monocytes Absolute: 0.7 10*3/uL (ref 0.1–1.0)
Monocytes Relative: 8 %
Neutro Abs: 7.2 10*3/uL (ref 1.7–7.7)
Neutrophils Relative %: 81 %
Platelets: 168 10*3/uL (ref 150–400)
RBC: 4.52 MIL/uL (ref 4.22–5.81)
RDW: 12.5 % (ref 11.5–15.5)
WBC: 8.9 10*3/uL (ref 4.0–10.5)
nRBC: 0 % (ref 0.0–0.2)

## 2022-02-23 LAB — URINALYSIS, ROUTINE W REFLEX MICROSCOPIC
Bilirubin Urine: NEGATIVE
Glucose, UA: NEGATIVE mg/dL
Hgb urine dipstick: NEGATIVE
Ketones, ur: NEGATIVE mg/dL
Leukocytes,Ua: NEGATIVE
Nitrite: NEGATIVE
Protein, ur: NEGATIVE mg/dL
Specific Gravity, Urine: >=1.03 (ref 1.005–1.030)
pH: 6.5 (ref 5.0–8.0)

## 2022-02-23 LAB — TROPONIN I (HIGH SENSITIVITY)
Troponin I (High Sensitivity): 4 ng/L (ref <18)
Troponin I (High Sensitivity): 4 ng/L (ref <18)

## 2022-02-23 LAB — LIPASE, BLOOD: Lipase: 44 U/L (ref 11–51)

## 2022-02-23 MED ORDER — PANTOPRAZOLE SODIUM 40 MG IV SOLR
40.0000 mg | Freq: Once | INTRAVENOUS | Status: DC
  Filled 2022-02-23: qty 10

## 2022-02-23 MED ORDER — PANTOPRAZOLE SODIUM 40 MG PO TBEC
40.0000 mg | DELAYED_RELEASE_TABLET | Freq: Once | ORAL | Status: AC
  Administered 2022-02-23: 40 mg via ORAL
  Filled 2022-02-23: qty 1

## 2022-02-23 MED ORDER — ALUM & MAG HYDROXIDE-SIMETH 200-200-20 MG/5ML PO SUSP
30.0000 mL | Freq: Once | ORAL | Status: AC
  Administered 2022-02-23: 30 mL via ORAL
  Filled 2022-02-23: qty 30

## 2022-02-23 NOTE — Discharge Instructions (Signed)
You were seen in the emergency department today for abdominal pain.  While you were here we looked at your abdominal organs as well as your lungs and heart which all looked normal and reassuring.  Like we discussed at the bedside this may be related to COVID, can be related to your acid reflux or may be related to the stress that you are unfortunately going through right now.  Please return to the emergency department for any worsening symptoms and were happy to take care of you.  Otherwise please follow-up with your primary care provider. ?

## 2022-02-23 NOTE — ED Provider Notes (Signed)
?[facility] DEPART[facility] ?Provider Note ? ? ?CSN: [id number] ?Arrival date & time: 02/23/22  1114 ? ?  ? ?History ? ?Chief Complaint  ?Patient presents with  ? Abdominal Pain  ? ? ?Zachary Lawrence. is a 71 y.o. male.  With past medical history of reflux, orthostatic hypotension who presents emergency department with abdominal pain. ? ?He states that last Friday his father was diagnosed with COVID-19.  He states that he had symptoms beginning on Sunday and then tested positive on Monday.  He states over the past few days he has been feeling "okay with no aches or pains or headaches."  However he states since Tuesday he has had lower chest and upper abdominal pain.  He states that it is more sharp than dull.  He states that it is nonradiating and hurts "for a few minutes and then quits."  He also endorses intermittent shortness of breath.  Denies chest pain, palpitations, lower extremity swelling, nausea, vomiting, diarrhea, fevers urinary symptoms.  Of note, his father passed away yesterday. ? ?HPI ? ?  ? ?Home Medications ?Prior to Admission medications   ?Medication Sig Start Date End Date Taking? Authorizing Provider  ?aspirin EC 325 MG tablet Take 325 mg by mouth as needed for mild pain. As directed    [provider]  ?benzonatate (TESSALON) 200 MG capsule Take 1 capsule (200 mg total) by mouth 2 (two) times daily as needed for cough. 02/20/22   Henson, Vickie L, NP-C  ?fluticasone (FLONASE) 50 MCG/ACT nasal spray Place 2 sprays into both nostrils daily. 02/20/22   Henson, Vickie L, NP-C  ?meclizine (ANTIVERT) 25 MG tablet Take 1 tablet (25 mg total) by mouth 3 (three) times daily as needed for dizziness. 08/31/21   Lucrezia Starch, MD  ?meloxicam (MOBIC) 7.5 MG tablet Take 1 tablet (7.5 mg total) by mouth 2 (two) times daily as needed for pain. 06/23/21   Leandrew Koyanagi, MD  ?OVER THE COUNTER MEDICATION as directed. Aerie's- otc for eyes.    [provider]  ?   ? ?Allergies     ?Atorvastatin   ? ?Review of Systems   ?Review of Systems  ?Constitutional:  Negative for appetite change, chills, diaphoresis and fever.  ?HENT:  Negative for congestion.   ?Respiratory:  Positive for shortness of breath.   ?Cardiovascular:  Negative for chest pain, palpitations and leg swelling.  ?Gastrointestinal:  Positive for abdominal pain. Negative for diarrhea, nausea and vomiting.  ?Genitourinary:  Negative for dysuria.  ?Neurological:  Negative for syncope and headaches.  ?All other systems reviewed and are negative. ? ?Physical Exam ?Updated Vital Signs ?BP 95/72 (BP Location: Right Arm)   Pulse (!) 101   Temp 97.6 ?F (36.4 ?C) (Oral)   Resp 20   Ht 6' (1.829 m)   Wt 82.1 kg   SpO2 100%   BMI 24.55 kg/m?  ?Physical Exam ?Vitals and nursing note reviewed.  ?Constitutional:   ?   General: He is not in acute distress. ?   Appearance: Normal appearance. He is well-developed and normal weight. He is ill-appearing. He is not toxic-appearing.  ?HENT:  ?   Head: Normocephalic and atraumatic.  ?   Nose: Nose normal.  ?   Mouth/Throat:  ?   Mouth: Mucous membranes are moist.  ?   Pharynx: Oropharynx is clear.  ?Eyes:  ?   General: No scleral icterus. ?   Pupils: Pupils are equal, round, and reactive to light.  ?  Cardiovascular:  ?   Rate and Rhythm: Normal rate and regular rhythm.  ?   Pulses: Normal pulses.  ?   Heart sounds: Normal heart sounds. No murmur heard. ?Pulmonary:  ?   Effort: Pulmonary effort is normal. No respiratory distress.  ?   Breath sounds: Normal breath sounds. No wheezing, rhonchi or rales.  ?Abdominal:  ?   General: Abdomen is flat. Bowel sounds are normal. There is no distension.  ?   Palpations: Abdomen is soft.  ?   Tenderness: There is generalized abdominal tenderness. There is no right CVA tenderness or left CVA tenderness. Negative signs include Murphy's sign and McBurney's sign.  ?   Hernia: No hernia is present.  ?Musculoskeletal:     ?   General: Normal range of motion.  ?    Cervical back: Neck supple.  ?   Right lower leg: No edema.  ?   Left lower leg: No edema.  ?Skin: ?   General: Skin is warm and dry.  ?   Capillary Refill: Capillary refill takes less than 2 seconds.  ?Neurological:  ?   General: No focal deficit present.  ?   Mental Status: He is alert and oriented to person, place, and time. Mental status is at baseline.  ?Psychiatric:     ?   Mood and Affect: Mood normal.     ?   Behavior: Behavior normal.     ?   Thought Content: Thought content normal.     ?   Judgment: Judgment normal.  ? ? ?ED Results / Procedures / Treatments   ?Labs ?(all labs ordered are listed, but only abnormal results are displayed) ?Labs Reviewed  ?COMPREHENSIVE METABOLIC PANEL - Abnormal; Notable for the following components:  ?    Result Value  ? Glucose, Bld 115 (*)   ? Calcium 8.7 (*)   ? Total Protein 6.2 (*)   ? All other components within normal limits  ?LIPASE, BLOOD  ?CBC WITH DIFFERENTIAL/PLATELET  ?URINALYSIS, ROUTINE W REFLEX MICROSCOPIC  ?TROPONIN I (HIGH SENSITIVITY)  ?TROPONIN I (HIGH SENSITIVITY)  ? ?EKG ?EKG Interpretation ? ?Date/Time:  Friday February 23 2022 11:25:32 EDT ?Ventricular Rate:  95 ?PR Interval:  152 ?QRS Duration: 72 ?QT Interval:  344 ?QTC Calculation: 432 ?R Axis:   76 ?Text Interpretation: Normal sinus rhythm Biatrial enlargement When compared with ECG of 31-Aug-2021 17:20, PREVIOUS ECG IS PRESENT Confirmed by Nanda Quinton [phone]) on 02/23/2022 11:29:17 AM ? ?Radiology ?DG Chest Port 1 View ? ?Result Date: 02/23/2022 ?CLINICAL DATA:  Abdominal pain.  COVID 4 days ago. EXAM: PORTABLE CHEST 1 VIEW COMPARISON:  04/28/2020 FINDINGS: The heart size and mediastinal contours are within normal limits. Both lungs are clear. The visualized skeletal structures are unremarkable. IMPRESSION: No active disease. Electronically Signed   By: Kerby Moors M.D.   On: 02/23/2022 12:33   ? ?Procedures ?Procedures  ? ?Medications Ordered in ED ?Medications  ?alum & mag hydroxide-simeth  (MAALOX/MYLANTA) 200-200-20 MG/5ML suspension 30 mL (30 mLs Oral Given 02/23/22 1301)  ?pantoprazole (PROTONIX) EC tablet 40 mg (40 mg Oral Given 02/23/22 1406)  ? ? ?ED Course/ Medical Decision Making/ A&P ?  ?                        ?Medical Decision Making ?Amount and/or Complexity of Data Reviewed ?Labs: ordered. ?Radiology: ordered. ? ?Risk ?OTC drugs. ?Prescription drug management. ? ?This patient presents to the ED for concern of chest  and abdominal pain, this involves an extensive number of treatment options, and is a complaint that carries with it a high risk of complications and morbidity.  The differential diagnosis includes ACS, pneumonia, gastroenteritis, cholecystitis, pancreatitis, gastritis, bowel obstruction, perforation, mesenteric ischemia, diverticulitis, cystitis, pyelonephritis, etc. ? ?Co morbidities that complicate the patient evaluation ?GERD ? ?Additional history obtained:  ?Additional history obtained from: None ?External records from outside source obtained and reviewed including: Previous primary care visits ? ?EKG: ?EKG: normal EKG, normal sinus rhythm, atrial enlargement.  ? ?Cardiac Monitoring: ?The patient was maintained on a cardiac monitor.  I personally viewed and interpreted the cardiac monitored which showed an underlying rhythm of: Sinus rhythm ? ?Lab Results: ?I personally ordered, reviewed, and interpreted labs. ?Pertinent results include: ?CBC within normal limits ?CMP within normal limits ?Lipase negative ?UA negative ?Troponin 4, 4, delta 0 ? ?Imaging Studies ordered:  ?I ordered imaging studies which included x-ray.  I independently reviewed & interpreted imaging & am in agreement with radiology impression. ?Imaging shows: ?No active disease ? ?Medications  ?I ordered medication including Maalox for Protonix ?Reevaluation of the patient after medication shows that patient stayed the same ? ?Tests Considered: ?CT abdomen pelvis with contrast ? ?ED Course: ?71 year old male  who presents to the emergency department with upper abdominal/lower chest pain.  ? ?On my exam, his abdominal pain appears to be more diffuse when he is describing it, however no objective tenderness to palpation

## 2022-02-23 NOTE — ED Notes (Signed)
ED Provider at bedside. 

## 2022-02-23 NOTE — ED Triage Notes (Signed)
Covid + 4 days ago. Here with c.o epigastric pain. EKG at triage. His father dies yesterday of Covid per pt. ?

## 2022-03-07 ENCOUNTER — Other Ambulatory Visit (HOSPITAL_COMMUNITY): Payer: Self-pay | Admitting: *Deleted

## 2022-03-07 ENCOUNTER — Ambulatory Visit (HOSPITAL_COMMUNITY): Payer: Medicare Other | Attending: Internal Medicine

## 2022-03-07 DIAGNOSIS — R0600 Dyspnea, unspecified: Secondary | ICD-10-CM | POA: Insufficient documentation

## 2022-03-07 DIAGNOSIS — R6889 Other general symptoms and signs: Secondary | ICD-10-CM | POA: Diagnosis not present

## 2022-03-07 DIAGNOSIS — Z87891 Personal history of nicotine dependence: Secondary | ICD-10-CM | POA: Diagnosis not present

## 2022-03-07 DIAGNOSIS — R079 Chest pain, unspecified: Secondary | ICD-10-CM | POA: Diagnosis not present

## 2022-03-19 ENCOUNTER — Telehealth: Payer: Self-pay

## 2022-03-19 ENCOUNTER — Other Ambulatory Visit: Payer: Self-pay | Admitting: Family Medicine

## 2022-03-19 DIAGNOSIS — R0981 Nasal congestion: Secondary | ICD-10-CM

## 2022-03-19 MED ORDER — MIDODRINE HCL 5 MG PO TABS: 5.0000 mg | ORAL_TABLET | Freq: Two times a day (BID) | ORAL | 11 refills | Status: DC

## 2022-03-19 NOTE — Telephone Encounter (Signed)
-----   Message from Evans Lance, MD sent at 03/11/2022  9:12 PM EDT ----- ?Exercise testing shows that his heart and lungs are working ok. He did not speed up his heart as much as he might have but I do not think that this had any significant contribution to his dyspnea.  ?

## 2022-03-20 NOTE — Telephone Encounter (Signed)
Per Dr. Sherron Ales Pt start midodrine 5 mg PO BID. ?Pt aware and medication sent to pharmacy. ? ?F/u scheduled. ?

## 2022-04-02 ENCOUNTER — Ambulatory Visit (INDEPENDENT_AMBULATORY_CARE_PROVIDER_SITE_OTHER): Payer: Medicare Other | Admitting: Medical

## 2022-04-02 VITALS — BP 120/80 | HR 68 | Temp 98.2°F | Resp 18 | Ht 72.0 in | Wt 173.8 lb

## 2022-04-02 DIAGNOSIS — H6123 Impacted cerumen, bilateral: Secondary | ICD-10-CM | POA: Diagnosis not present

## 2022-04-02 NOTE — Patient Instructions (Addendum)
Bilateral cerumen impaction. Rt worse than left prelavage. Verbal consent given. ? ?MA successfully lavage left side ear canal.  Partially successful right side lavage.  Presently advised to wait a couple days to see if the right ear begins to feel normal.  Presently reporting feels a little bit blocked now compared to the left side.  If  rt ear still feeling some blocked sensation in 2 days then start Debrox then set up for repeat rt side lavage for 5th day after starting debrox. ? ? ?

## 2022-04-02 NOTE — Progress Notes (Signed)
? ?Subjective:  ? ? Patient ID: Zachary Bard., male    DOB: Apr 03, 1951, 71 y.o.   MRN: [id number] ? ?HPI ? ?Pt in reporting he gets ears washed out intermittently over the years. Both feels blocked but left side worse. This morning left ear felt very blocked/reduced hearing. Pt has tried not use q tips but he did try to clearn briefly on outside. He states did not do deep clean. ? ?Mild sneezing on occasion. No major nasal congestion. No ear pain. ? ?Explained procedure to pt and he gave verbal consent. ? ?Review of Systems  ?Constitutional:  Negative for chills, fatigue and fever.  ?Respiratory:  Negative for cough, chest tightness, shortness of breath and wheezing.   ?Cardiovascular:  Negative for chest pain and palpitations.  ?Gastrointestinal:  Negative for abdominal pain and nausea.  ?Genitourinary:  Negative for dysuria and frequency.  ?Musculoskeletal:  Negative for back pain and myalgias.  ?Skin:  Negative for rash.  ? ? ?Past Medical History:  ?Diagnosis Date  ? GERD (gastroesophageal reflux disease)   ? History of hepatitis C   ? 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    ? Orthostatic hypotension 06/15/2021  ? Overactive bladder   ? ?  ?Social History  ? ?Socioeconomic History  ? Marital status: Divorced  ?  Spouse name: Not on file  ? Number of children: 2  ? Years of education: Not on file  ? Highest education level: Not on file  ?Occupational History  ? Occupation: realtor  ?Tobacco Use  ? Smoking status: Former  ?  Types: Cigarettes  ?  Quit date: 12/11/1975  ?  Years since quitting: 46.3  ? Smokeless tobacco: Never  ?Vaping Use  ? Vaping Use: Never used  ?Substance and Sexual Activity  ? Alcohol use: Yes  ?  Comment: social  ? Drug use: No  ? Sexual activity: Yes  ?Other Topics Concern  ? Not on file  ?Social History Narrative  ? Lives alone.    ? ?Social Determinants of Health  ? ?Financial Resource Strain: Low Risk   ? Difficulty of Paying Living Expenses: Not hard at all  ?Food Insecurity: No Food  Insecurity  ? Worried About Charity fundraiser in the Last Year: Never true  ? Ran Out of Food in the Last Year: Never true  ?Transportation Needs: No Transportation Needs  ? Lack of Transportation (Medical): No  ? Lack of Transportation (Non-Medical): No  ?Physical Activity: Sufficiently Active  ? Days of Exercise per Week: 3 days  ? Minutes of Exercise per Session: 60 min  ?Stress: No Stress Concern Present  ? Feeling of Stress : Not at all  ?Social Connections: Moderately Integrated  ? Frequency of Communication with Friends and Family: More than three times a week  ? Frequency of Social Gatherings with Friends and Family: More than three times a week  ? Attends Religious Services: 1 to 4 times per year  ? Active Member of Clubs or Organizations: Yes  ? Attends Archivist Meetings: 1 to 4 times per year  ? Marital Status: Divorced  ?Intimate Partner Violence: Not At Risk  ? Fear of Current or Ex-Partner: No  ? Emotionally Abused: No  ? Physically Abused: No  ? Sexually Abused: No  ? ? ?Past Surgical History:  ?Procedure Laterality Date  ? CHOLECYSTECTOMY  1992  ? TONSILLECTOMY    ? ? ?Family History  ?Problem Relation Age of Onset  ? CAD Father  60  ?     CABG/Stent  ? Heart disease Father   ?     CAD with CABG and stent  ? Ovarian cancer Sister   ? Alzheimer's disease Mother   ? Heart disease Paternal Uncle   ? Cancer Paternal Uncle   ?     prostate cancer  ? ? ?Allergies  ?Allergen Reactions  ? Atorvastatin Other (See Comments)  ?  Muscle and joint pain.  ? ? ?Current Outpatient Medications on File Prior to Visit  ?Medication Sig Dispense Refill  ? aspirin EC 325 MG tablet Take 325 mg by mouth as needed for mild pain. As directed    ? fluticasone (FLONASE) 50 MCG/ACT nasal spray SHAKE LIQUID AND USE 2 SPRAYS IN EACH NOSTRIL DAILY 16 g 0  ? meclizine (ANTIVERT) 25 MG tablet Take 1 tablet (25 mg total) by mouth 3 (three) times daily as needed for dizziness. 30 tablet 0  ? meloxicam (MOBIC) 7.5 MG tablet  Take 1 tablet (7.5 mg total) by mouth 2 (two) times daily as needed for pain. 30 tablet 2  ? midodrine (PROAMATINE) 5 MG tablet Take 1 tablet (5 mg total) by mouth 2 (two) times daily with a meal. 60 tablet 11  ? OVER THE COUNTER MEDICATION as directed. Aerie's- otc for eyes.    ? ?No current facility-administered medications on file prior to visit.  ? ? ?BP 120/80   Pulse 68   Temp 98.2 ?F (36.8 ?C)   Resp 18   Ht 6' (1.829 m)   Wt 173 lb 12.8 oz (78.8 kg)   SpO2 100%   BMI 23.57 kg/m?  ?  ?   ?Objective:  ? Physical Exam ? ?General- No acute distress. Pleasant patient. ?Neck- Full range of motion, no jvd ?Lungs- Clear, even and unlabored. ?Heart- regular rate and rhythm. ?Neurologic- CNII- XII grossly intact.  ?Ears- bilateral cerumen impaction. Both blocked with wax. No portion of tm seen pre lavage.  Post lavage left canal was cleared completely with normal appearing TM.  On the right side post lavage portion of wax from the center was removed but there is still some wax attached to the wall.  I can only see small portion of the TM which appeared normal. ? ? ?   ?Assessment & Plan:  ? ?Patient Instructions  ?Bilateral cerumen impaction. Rt worse than left prelavage. Verbal consent given. ? ?Successfully lavage left side ear canal.  Partially successful right side lavage.  Presently advised to wait a couple days to see if the right ear begins to feel normal.  Presently reporting feels a little bit blocked now compared to the left side.  If  rt ear still feeling some blocked sensation in 2 days then start Debrox then set up for repeat rt side lavage for 5th day after starting debrox. ? ?  ?Mackie Pai, PA-C  ?

## 2022-04-25 ENCOUNTER — Encounter: Payer: Self-pay | Admitting: Cardiovascular Disease

## 2022-04-25 ENCOUNTER — Ambulatory Visit (INDEPENDENT_AMBULATORY_CARE_PROVIDER_SITE_OTHER): Payer: Medicare Other | Admitting: Cardiovascular Disease

## 2022-04-25 VITALS — BP 120/70 | HR 55 | Ht 72.0 in | Wt 176.0 lb

## 2022-04-25 DIAGNOSIS — I951 Orthostatic hypotension: Secondary | ICD-10-CM | POA: Diagnosis not present

## 2022-04-25 DIAGNOSIS — E785 Hyperlipidemia, unspecified: Secondary | ICD-10-CM | POA: Diagnosis not present

## 2022-04-25 DIAGNOSIS — R0789 Other chest pain: Secondary | ICD-10-CM | POA: Diagnosis not present

## 2022-04-25 DIAGNOSIS — E782 Mixed hyperlipidemia: Secondary | ICD-10-CM | POA: Diagnosis not present

## 2022-04-25 NOTE — Assessment & Plan Note (Signed)
Improved with midodrine. ?

## 2022-04-25 NOTE — Assessment & Plan Note (Signed)
History of hyperlipidemia with lipid profile performed 11/14/2020 revealing total cholesterol 196, LDL 117 and HDL 69.  I am going to repeat a fasting lipid liver profile. ?

## 2022-04-25 NOTE — Assessment & Plan Note (Signed)
History of atypical chest pain with a coronary CTA revealing a coronary calcium score 60 with distal LAD disease probably of no clinical significance.  He gets occasional noncardiac chest pain. ?

## 2022-04-25 NOTE — Patient Instructions (Signed)
Medication Instructions:  Your physician recommends that you continue on your current medications as directed. Please refer to the Current Medication list given to you today.  *If you need a refill on your cardiac medications before your next appointment, please call your pharmacy*   Lab Work: Your physician recommends that you return for lab work in: the next week or 2 for FASTING lipid/liver profile  If you have labs (blood work) drawn today and your tests are completely normal, you will receive your results only by: MyChart Message (if you have MyChart) OR A paper copy in the mail If you have any lab test that is abnormal or we need to change your treatment, we will call you to review the results.   Follow-Up: At [facility], you and your health needs are our priority.  As part of our continuing mission to provide you with exceptional heart care, we have created designated Provider Care Teams.  These Care Teams include your primary Cardiologist (physician) and Advanced Practice Providers (APPs -  Physician Assistants and Nurse Practitioners) who all work together to provide you with the care you need, when you need it.  We recommend signing up for the patient portal called "MyChart".  Sign up information is provided on this After Visit Summary.  MyChart is used to connect with patients for Virtual Visits (Telemedicine).  Patients are able to view lab/test results, encounter notes, upcoming appointments, etc.  Non-urgent messages can be sent to your provider as well.   To learn more about what you can do with MyChart, go to [email or web address].    Your next appointment:   12 month(s)  The format for your next appointment:   In Person  Provider:   Jonathan Berry, MD  

## 2022-04-25 NOTE — Progress Notes (Signed)
? ? ? ?04/25/2022 ?Zachary Lawrence [employer].   ?16-Sep-1951  ?[id number] ? ?Primary Physician Zachary Lawrence ?Primary Cardiologist: Lorretta Harp MD Zachary Lawrence ? ?HPI:  Zachary Lawrence. is a 71 y.o.  thin and fit appearing divorced Caucasian male father of 2, grandfather for grandchildren referred by Zachary Lawrence, his PCP, for evaluation of atypical chest pain.  I last saw him in the office 06/16/2021.  Nuclear was prepped He was evaluated 4 years ago by Zachary Lawrence with a negative GXT at that time for similar symptoms.  He works in Personal assistant.  His only risk factor is family history with father's who has had bypass surgery.  He is never had a heart attack or stroke.  He complains of quick fleeting sharp substernal chest pain that comes on for no apparent reason.  Also complains of some dyspnea exertion and dizziness. ?  ?He had a coronary CTA that revealed a coronary calcium score of 60 with distal LAD disease probably of no clinical significance.  A 2D echocardiogram performed 05/31/2020 revealed normal LV systolic function with grade 1 diastolic dysfunction and no valvular abnormalities other than mild MR.  Because of his episodic dizziness I did a 2-week Zio patch that showed short runs of SVT probably noncontributory.  I did begin him on low-dose atorvastatin because of an LDL of 111 to intensify his primary prevention which she did not tolerate. ? ?He continued to have episodes of episodic dizziness occurring several times a week lasting for seconds to a minute at a time without loss of consciousness.  His primary care provider, Zachary Alar Lawrence asked about starting Florinef which I think would be a good empiric therapy.  He did see Dr. Lovena Lawrence who recommended midodrine which has afforded him some improvement in his symptoms. ? ?Since I saw him a year ago his symptoms of dizziness have improved.  He gets occasional atypical chest pain. ? ? ?Current Meds  ?Medication Sig  ? aspirin EC 325 MG  tablet Take 325 mg by mouth as needed for mild pain. As directed  ? midodrine (PROAMATINE) 5 MG tablet Take 1 tablet (5 mg total) by mouth 2 (two) times daily with a meal.  ? OVER THE COUNTER MEDICATION as directed. Aerie's- otc for eyes.  ?  ? ?Allergies  ?Allergen Reactions  ? Atorvastatin Other (See Comments)  ?  Muscle and joint pain.  ? ? ?Social History  ? ?Socioeconomic History  ? Marital status: Divorced  ?  Spouse name: Not on file  ? Number of children: 2  ? Years of education: Not on file  ? Highest education level: Not on file  ?Occupational History  ? Occupation: realtor  ?Tobacco Use  ? Smoking status: Former  ?  Types: Cigarettes  ?  Quit date: 12/11/1975  ?  Years since quitting: 46.4  ? Smokeless tobacco: Never  ?Vaping Use  ? Vaping Use: Never used  ?Substance and Sexual Activity  ? Alcohol use: Yes  ?  Comment: social  ? Drug use: No  ? Sexual activity: Yes  ?Other Topics Concern  ? Not on file  ?Social History Narrative  ? Lives alone.    ? ?Social Determinants of Health  ? ?Financial Resource Strain: Low Risk   ? Difficulty of Paying Living Expenses: Not hard at all  ?Food Insecurity: No Food Insecurity  ? Worried About Charity fundraiser in the Last Year: Never true  ? Ran Out of  Food in the Last Year: Never true  ?Transportation Needs: No Transportation Needs  ? Lack of Transportation (Medical): No  ? Lack of Transportation (Non-Medical): No  ?Physical Activity: Sufficiently Active  ? Days of Exercise per Week: 3 days  ? Minutes of Exercise per Session: 60 min  ?Stress: No Stress Concern Present  ? Feeling of Stress : Not at all  ?Social Connections: Moderately Integrated  ? Frequency of Communication with Friends and Family: More than three times a week  ? Frequency of Social Gatherings with Friends and Family: More than three times a week  ? Attends Religious Services: 1 to 4 times per year  ? Active Member of Clubs or Organizations: Yes  ? Attends Archivist Meetings: 1 to 4 times  per year  ? Marital Status: Divorced  ?Intimate Partner Violence: Not At Risk  ? Fear of Current or Ex-Partner: No  ? Emotionally Abused: No  ? Physically Abused: No  ? Sexually Abused: No  ?  ? ?Review of Systems: ?General: negative for chills, fever, night sweats or weight changes.  ?Cardiovascular: negative for chest pain, dyspnea on exertion, edema, orthopnea, palpitations, paroxysmal nocturnal dyspnea or shortness of breath ?Dermatological: negative for rash ?Respiratory: negative for cough or wheezing ?Urologic: negative for hematuria ?Abdominal: negative for nausea, vomiting, diarrhea, bright red blood per rectum, melena, or hematemesis ?Neurologic: negative for visual changes, syncope, or dizziness ?All other systems reviewed and are otherwise negative except as noted above. ? ? ? ?Blood pressure 120/70, pulse (!) 55, height 6' (1.829 m), weight 176 lb (79.8 kg), SpO2 100 %.  ?General appearance: alert and no distress ?Neck: no adenopathy, no carotid bruit, no JVD, supple, symmetrical, trachea midline, and thyroid not enlarged, symmetric, no tenderness/mass/nodules ?Lungs: clear to auscultation bilaterally ?Heart: regular rate and rhythm, S1, S2 normal, no murmur, click, rub or gallop ?Extremities: extremities normal, atraumatic, no cyanosis or edema ?Pulses: 2+ and symmetric ?Skin: Skin color, texture, turgor normal. No rashes or lesions ?Neurologic: Grossly normal ? ?EKG not performed today ? ?ASSESSMENT AND PLAN:  ? ?Atypical chest pain ?History of atypical chest pain with a coronary CTA revealing a coronary calcium score 60 with distal LAD disease probably of no clinical significance.  He gets occasional noncardiac chest pain. ? ?Hyperlipidemia ?History of hyperlipidemia with lipid profile performed 11/14/2020 revealing total cholesterol 196, LDL 117 and HDL 69.  I am going to repeat a fasting lipid liver profile. ? ?Orthostatic hypotension ?Improved with midodrine. ? ? ? ? ?Lorretta Harp MD  FACP,FACC,FAHA, FSCAI ?04/25/2022 ?2:24 PM ?

## 2022-05-14 DIAGNOSIS — E785 Hyperlipidemia, unspecified: Secondary | ICD-10-CM | POA: Diagnosis not present

## 2022-05-14 DIAGNOSIS — R0789 Other chest pain: Secondary | ICD-10-CM | POA: Diagnosis not present

## 2022-05-14 LAB — LIPID PANEL
Chol/HDL Ratio: 2.9 ratio (ref 0.0–5.0)
Cholesterol, Total: 217 mg/dL — ABNORMAL HIGH (ref 100–199)
HDL: 76 mg/dL (ref >39)
LDL Chol Calc (NIH): 131 mg/dL — ABNORMAL HIGH (ref 0–99)
Triglycerides: 58 mg/dL (ref 0–149)
VLDL Cholesterol Cal: 10 mg/dL (ref 5–40)

## 2022-05-14 LAB — HEPATIC FUNCTION PANEL
ALT: 12 IU/L (ref 0–44)
AST: 22 IU/L (ref 0–40)
Albumin: 4.5 g/dL (ref 3.7–4.7)
Alkaline Phosphatase: 55 IU/L (ref 44–121)
Bilirubin Total: 1.4 mg/dL — ABNORMAL HIGH (ref 0.0–1.2)
Bilirubin, Direct: 0.3 mg/dL (ref 0.00–0.40)
Total Protein: 6.3 g/dL (ref 6.0–8.5)

## 2022-05-24 ENCOUNTER — Telehealth: Payer: Self-pay | Admitting: Cardiovascular Disease

## 2022-05-24 ENCOUNTER — Other Ambulatory Visit: Payer: Self-pay

## 2022-05-24 DIAGNOSIS — E785 Hyperlipidemia, unspecified: Secondary | ICD-10-CM

## 2022-05-24 MED ORDER — EZETIMIBE 10 MG PO TABS: 10.0000 mg | ORAL_TABLET | Freq: Every day | ORAL | 3 refills | Status: DC

## 2022-05-24 NOTE — Telephone Encounter (Signed)
Follow Up:      Patient is returning a call from yesterday, concerning his lab results.

## 2022-05-24 NOTE — Telephone Encounter (Signed)
Called and spoke with pt. Given Dr. Kennon Holter recommendations. Prescription sent to pharmacy. Lab orders placed. Orders placed in mail for pt.

## 2022-05-29 ENCOUNTER — Telehealth: Payer: Self-pay | Admitting: Cardiovascular Disease

## 2022-05-29 NOTE — Telephone Encounter (Signed)
Pt c/o medication issue:  1. Name of Medication: ezetimibe (ZETIA) 10 MG tablet  2. How are you currently taking this medication (dosage and times per day)? Not currently taking   3. Are you having a reaction (difficulty breathing--STAT)? No   4. What is your medication issue? Patient is calling stating the copay for this medication is over $100. He is wanting to know if there is any assistance he can receive for the cost or an alternative medication that can be prescribed. Please advise.

## 2022-05-29 NOTE — Telephone Encounter (Signed)
Per patient request, patient assistant application for Zetia mailed to him.

## 2022-05-29 NOTE — Telephone Encounter (Signed)
Spoke with pt regarding high cost of zetia. Able to find goodRX coupon for 90 day supply of zetia for $35. Provided information to pt. Pt will try this at his pharmacy and let us know if he has any issues. Pt verbalizes understanding.

## 2022-06-08 ENCOUNTER — Ambulatory Visit (INDEPENDENT_AMBULATORY_CARE_PROVIDER_SITE_OTHER): Payer: Medicare Other | Admitting: Internal Medicine

## 2022-06-08 ENCOUNTER — Encounter: Payer: Self-pay | Admitting: Internal Medicine

## 2022-06-08 ENCOUNTER — Telehealth: Payer: Self-pay

## 2022-06-08 VITALS — BP 124/70 | HR 64 | Ht 72.0 in | Wt 175.6 lb

## 2022-06-08 DIAGNOSIS — I951 Orthostatic hypotension: Secondary | ICD-10-CM

## 2022-06-08 DIAGNOSIS — I4589 Other specified conduction disorders: Secondary | ICD-10-CM

## 2022-06-08 MED ORDER — FLUDROCORTISONE ACETATE 0.1 MG PO TABS: 0.1000 mg | ORAL_TABLET | Freq: Two times a day (BID) | ORAL | 1 refills | Status: DC

## 2022-06-08 NOTE — Patient Instructions (Addendum)
Medication Instructions:  Your physician has recommended you make the following change in your medication:  Stop taking your Midorine 5 mg.  2.  Start taking Florinef 0.1 mg-  Take ONE tablet by mouth TWICE a day.     Labwork: None ordered.  Testing/Procedures: None ordered.  Follow-Up: Your physician wants you to follow-up in:  4 months with Cristopher Peru, MD or one of the following Advanced Practice Providers on your designated Care Team:   Tommye Standard, [place] Legrand Como "Jonni Sanger" Chalmers Cater, [place]    Any Other Special Instructions Will Be Listed Below (If Applicable).  If you need a refill on your cardiac medications before your next appointment, please call your pharmacy.   Important Information About Sugar

## 2022-06-08 NOTE — Progress Notes (Signed)
HPI Mr. Arabie returns today for followup. I saw him several weeks ago and thought that his symptoms were most consistent with autonomic dysfunction. He has tried to increase his salt and fluid intake. He has known non-obstructive CAD. He notes that when he exerts himself vigorously he get tired and sob. No anginal symptoms were noted. 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 No syncope. he had a GXT test which showed orthostasis and mild chronotropic incompetence with his HR getting to 116/min. He was hypotensive after exercise. I started him on midodrine which initially helped but he has had some side effects and still notes that he does not have the energy he would like. He has no evidence of obstructive CAD.  Allergies  Allergen Reactions   Atorvastatin Other (See Comments)    Muscle and joint pain.     Current Outpatient Medications  Medication Sig Dispense Refill   aspirin EC 325 MG tablet Take 325 mg by mouth as needed for mild pain. As directed     ezetimibe (ZETIA) 10 MG tablet Take 1 tablet (10 mg total) by mouth daily. 90 tablet 3   fludrocortisone (FLORINEF) 0.1 MG tablet Take 1 tablet (0.1 mg total) by mouth 2 (two) times daily. 180 tablet 1   OVER THE COUNTER MEDICATION as directed. Aerie's- otc for eyes.     No current facility-administered medications for this visit.     Past Medical History:  Diagnosis Date   GERD (gastroesophageal reflux disease)    History of hepatitis C    s/p curative therapy in the 90's.     Orthostatic hypotension 06/15/2021   Overactive bladder     ROS:   All systems reviewed and negative except as noted in the HPI.   Past Surgical History:  Procedure Laterality Date   CHOLECYSTECTOMY  1992   TONSILLECTOMY       Family History  Problem Relation Age of Onset   CAD Father 16       CABG/Stent   Heart disease Father        CAD with CABG and stent   Ovarian cancer  Sister    Alzheimer's disease Mother    Heart disease Paternal Uncle    Cancer Paternal Uncle        prostate cancer     Social History   Socioeconomic History   Marital status: Divorced    Spouse name: Not on file   Number of children: 2   Years of education: Not on file   Highest education level: Not on file  Occupational History   Occupation: realtor  Tobacco Use   Smoking status: Former    Types: Cigarettes    Quit date: 12/11/1975    Years since quitting: 46.5   Smokeless tobacco: Never  Vaping Use   Vaping Use: Never used  Substance and Sexual Activity   Alcohol use: Yes    Comment: social   Drug use: No   Sexual activity: Yes  Other Topics Concern   Not on file  Social History Narrative   Lives alone.     Social Determinants of Health   Financial Resource Strain: Low Risk  (10/26/2021)   Overall Financial Resource Strain (CARDIA)    Difficulty of Paying Living Expenses: Not hard at all  Food Insecurity: No Food Insecurity (10/26/2021)   Hunger Vital Sign    Worried About Running Out of  Food in the Last Year: Never true    [place] in the Last Year: Never true  Transportation Needs: No Transportation Needs (10/26/2021)   PRAPARE - Hydrologist (Medical): No    Lack of Transportation (Non-Medical): No  Physical Activity: Sufficiently Active (10/26/2021)   Exercise Vital Sign    Days of Exercise per Week: 3 days    Minutes of Exercise per Session: 60 min  Stress: No Stress Concern Present (10/26/2021)   [place]    Feeling of Stress : Not at all  Social Connections: Moderately Integrated (10/26/2021)   Social Connection and Isolation Panel [NHANES]    Frequency of Communication with Friends and Family: More than three times a week    Frequency of Social Gatherings with Friends and Family: More than three times a week    Attends Religious Services: 1 to 4  times per year    Active Member of [employer] or Organizations: Yes    Attends Archivist Meetings: 1 to 4 times per year    Marital Status: Divorced  Human resources officer Violence: Not At Risk (10/26/2021)   Humiliation, Afraid, Rape, and Kick questionnaire    Fear of Current or Ex-Partner: No    Emotionally Abused: No    Physically Abused: No    Sexually Abused: No     BP 124/70   Pulse 64   Ht 6' (1.829 m)   Wt 175 lb 9.6 oz (79.7 kg)   SpO2 99%   BMI 23.82 kg/m   Physical Exam:  Well appearing NAD HEENT: Unremarkable Neck:  No JVD, no thyromegally Lymphatics:  No adenopathy Back:  No CVA tenderness Lungs:  Clear with no wheezes HEART:  Regular rate rhythm, no murmurs, no rubs, no clicks Abd:  soft, positive bowel sounds, no organomegally, no rebound, no guarding Ext:  2 plus pulses, no edema, no cyanosis, no clubbing Skin:  No rashes no nodules Neuro:  CN II through XII intact, motor grossly intact   Assess/Plan:   Unexplained dyspnea/fatigue/sob - He has undergone CPX testing with mild chronotropic incompetence and evidence of orthostasis. Probable autonomic dysfunction - maybe a little better but still somewhat symptomatic. We will stop midodrine and start florinef and get a bmp in 2-3 weeks.   Carleene Overlie Jeran Hiltz,MD

## 2022-06-08 NOTE — Telephone Encounter (Signed)
Mychart message sent.  Need to check BMP in 2 weeks for new start florinef.

## 2022-06-13 NOTE — Telephone Encounter (Signed)
Outreach made to Pt.  Left VM requesting call back to schedule lab work week of July 10th.

## 2022-06-18 NOTE — Telephone Encounter (Signed)
Left message for Pt to call back to schedule lab work.  Needs labs week of June 18, 2022.  BMP order entered.

## 2022-06-20 ENCOUNTER — Other Ambulatory Visit: Payer: Medicare Other

## 2022-06-20 DIAGNOSIS — I951 Orthostatic hypotension: Secondary | ICD-10-CM

## 2022-06-20 LAB — BASIC METABOLIC PANEL
BUN/Creatinine Ratio: 17 (ref 10–24)
BUN: 19 mg/dL (ref 8–27)
CO2: 27 mmol/L (ref 20–29)
Calcium: 9 mg/dL (ref 8.6–10.2)
Chloride: 104 mmol/L (ref 96–106)
Creatinine, Ser: 1.09 mg/dL (ref 0.76–1.27)
Glucose: 91 mg/dL (ref 70–99)
Potassium: 3.8 mmol/L (ref 3.5–5.2)
Sodium: 142 mmol/L (ref 134–144)
eGFR: 73 mL/min/{1.73_m2} (ref >59)

## 2022-06-21 NOTE — Telephone Encounter (Signed)
Lab work complete

## 2022-07-03 ENCOUNTER — Telehealth: Payer: Self-pay | Admitting: Internal Medicine

## 2022-07-03 NOTE — Telephone Encounter (Signed)
Pt called back in to report checked BP at [place]: 142/85-73 and 153/98-74.  Pt again reports feels fidgety and wonders if should go to the ED.  I advised pt that BP is not dangerously high but if he feels that an ED visit is needed then go to the ED. Also, advised pt to reach out to PCP office.  Will route to primary nurse to follow up.

## 2022-07-03 NOTE — Telephone Encounter (Signed)
Called pt in regards to BP.  Pt reports has not checked BP in a long time.  Expresses can't sit still or lay down.  Feels as if will jump out of skin.   Pt does not have a BP monitor in possession [place] the beach.  Advised pt that we need a recent BP to help assess pt.  Reports was given a medication to increase BP so assumes BP is low.  Advised pt still need a recent BP to direct care.  Pt at the beach has had some sweating.  Pt drinking water advised may need PO fluids with electrolytes.  Also advised to go to nearest pharmacy with a BP machine to check BP if can't not get one for personal use at this time.  Pt expressed will go to the nearest pharmacy to check BP.  Advised pt to call 911 or go to ED if symptoms worsen.  Pt will call back in with BP reading.

## 2022-07-03 NOTE — Telephone Encounter (Signed)
Pt c/o BP issue: STAT if pt c/o blurred vision, one-sided weakness or slurred speech  1. What are your last 5 BP readings? Does not have   2. Are you having any other symptoms (ex. Dizziness, headache, blurred vision, passed out)? Yes  3. What is your BP issue? Pt is at the beach and is having issues with what he believes is his bp. States he feels off. Can not sleep and having to stay moving, but is feeling very fatigued. Says he feels off balance at times. He took his bp meds this morning and yesterday, but while at [place] this past weekend, forgot his medication. Requesting call back.

## 2022-07-04 ENCOUNTER — Ambulatory Visit (INDEPENDENT_AMBULATORY_CARE_PROVIDER_SITE_OTHER): Payer: Medicare Other | Admitting: Family

## 2022-07-04 ENCOUNTER — Telehealth: Payer: Self-pay

## 2022-07-04 VITALS — BP 150/86 | HR 58 | Temp 98.4°F | Resp 16 | Wt 177.0 lb

## 2022-07-04 DIAGNOSIS — I951 Orthostatic hypotension: Secondary | ICD-10-CM | POA: Diagnosis not present

## 2022-07-04 DIAGNOSIS — R5383 Other fatigue: Secondary | ICD-10-CM | POA: Diagnosis not present

## 2022-07-04 LAB — COMPREHENSIVE METABOLIC PANEL
ALT: 11 U/L (ref 0–53)
AST: 17 U/L (ref 0–37)
Albumin: 4.3 g/dL (ref 3.5–5.2)
Alkaline Phosphatase: 50 U/L (ref 39–117)
BUN: 15 mg/dL (ref 6–23)
CO2: 28 mEq/L (ref 19–32)
Calcium: 9.2 mg/dL (ref 8.4–10.5)
Chloride: 106 mEq/L (ref 96–112)
Creatinine, Ser: 0.94 mg/dL (ref 0.40–1.50)
GFR: 81.56 mL/min (ref >60.00)
Glucose, Bld: 96 mg/dL (ref 70–99)
Potassium: 3.5 mEq/L (ref 3.5–5.1)
Sodium: 142 mEq/L (ref 135–145)
Total Bilirubin: 2.2 mg/dL — ABNORMAL HIGH (ref 0.2–1.2)
Total Protein: 6.2 g/dL (ref 6.0–8.3)

## 2022-07-04 LAB — CBC WITH DIFFERENTIAL/PLATELET
Basophils Absolute: 0 10*3/uL (ref 0.0–0.1)
Basophils Relative: 0.9 % (ref 0.0–3.0)
Eosinophils Absolute: 0.1 10*3/uL (ref 0.0–0.7)
Eosinophils Relative: 1.7 % (ref 0.0–5.0)
HCT: 39.9 % (ref 39.0–52.0)
Hemoglobin: 13.6 g/dL (ref 13.0–17.0)
Lymphocytes Relative: 21.1 % (ref 12.0–46.0)
Lymphs Abs: 1.1 10*3/uL (ref 0.7–4.0)
MCHC: 34.2 g/dL (ref 30.0–36.0)
MCV: 93.3 fl (ref 78.0–100.0)
Monocytes Absolute: 0.4 10*3/uL (ref 0.1–1.0)
Monocytes Relative: 8.7 % (ref 3.0–12.0)
Neutro Abs: 3.4 10*3/uL (ref 1.4–7.7)
Neutrophils Relative %: 67.6 % (ref 43.0–77.0)
Platelets: 145 10*3/uL — ABNORMAL LOW (ref 150.0–400.0)
RBC: 4.27 Mil/uL (ref 4.22–5.81)
RDW: 13.8 % (ref 11.5–15.5)
WBC: 5 10*3/uL (ref 4.0–10.5)

## 2022-07-04 LAB — T3, FREE: T3, Free: 2.9 pg/mL (ref 2.3–4.2)

## 2022-07-04 LAB — T4, FREE: Free T4: 1.01 ng/dL (ref 0.60–1.60)

## 2022-07-04 LAB — TSH: TSH: 1.72 u[IU]/mL (ref 0.35–5.50)

## 2022-07-04 LAB — CK: Total CK: 123 U/L (ref 7–232)

## 2022-07-04 NOTE — Telephone Encounter (Signed)
Patient was scheduled to be seen today

## 2022-07-04 NOTE — Progress Notes (Addendum)
Subjective:   By signing my name below, I, [name], attest that this documentation has been prepared under the direction and in the presence of Sandford Craze, NP 07/04/2022     Patient ID: Zachary Lawrence., male    DOB: 07/16/1951, 71 y.o.   MRN: [id number]  Chief Complaint  Patient presents with   Fatigue    Patient complains of fatigue since monday    HPI Patient is in today for follow up visit.  Fidgeting/Discomfort: He had difficulty sleeping on Monday night. He adds explains that he felt "figgity" and agitated all night which made it difficult for him to sleep. He states that he felt like he needed to take a deep breath and describes it as him "feeling possessed." On Tuesday, he went to the shade after staying in the sun for a long time at the beach. After sitting in the shade he began to experience the "figgiting" symptoms again, and his discomfort did not resolve. He just felt unsettled and couldn't sit still. He ended up coming home from the beach early as a result.  Autonomic dysfunction/Orthostatic Hypotension- following with cardiology. Now on Fluorinef which has helped a lot to lessen his severe weakness episodes he was having. He does have some generalized ongoing fatigue.   Anxiety: He reports having work related stress.  This has been looming for the last year or so.    Health Maintenance Due  Topic Date Due   Zoster Vaccines- Shingrix (1 of 2) Never done    Past Medical History:  Diagnosis Date   GERD (gastroesophageal reflux disease)    History of hepatitis C    s/p curative therapy in the 90's.     Orthostatic hypotension 06/15/2021   Overactive bladder     Past Surgical History:  Procedure Laterality Date   CHOLECYSTECTOMY  1992   TONSILLECTOMY      Family History  Problem Relation Age of Onset   CAD Father 54       CABG/Stent   Heart disease Father        CAD with CABG and stent   Ovarian cancer Sister    Alzheimer's disease Mother     Heart disease Paternal Uncle    Cancer Paternal Uncle        prostate cancer    Social History   Socioeconomic History   Marital status: Divorced    Spouse name: Not on file   Number of children: 2   Years of education: Not on file   Highest education level: Not on file  Occupational History   Occupation: realtor  Tobacco Use   Smoking status: Former    Types: Cigarettes    Quit date: 12/11/1975    Years since quitting: 46.5   Smokeless tobacco: Never  Vaping Use   Vaping Use: Never used  Substance and Sexual Activity   Alcohol use: Yes    Comment: social   Drug use: No   Sexual activity: Yes  Other Topics Concern   Not on file  Social History Narrative   Lives alone.     Social Determinants of Health   Financial Resource Strain: Low Risk  (10/26/2021)   Overall Financial Resource Strain (CARDIA)    Difficulty of Paying Living Expenses: Not hard at all  Food Insecurity: No Food Insecurity (10/26/2021)   Hunger Vital Sign    Worried About Running Out of Food in the Last Year: Never true    Ran Out of  Food in the Last Year: Never true  Transportation Needs: No Transportation Needs (10/26/2021)   PRAPARE - Administrator, Civil Service (Medical): No    Lack of Transportation (Non-Medical): No  Physical Activity: Sufficiently Active (10/26/2021)   Exercise Vital Sign    Days of Exercise per Week: 3 days    Minutes of Exercise per Session: 60 min  Stress: No Stress Concern Present (10/26/2021)   [employer] of Occupational Health - Occupational Stress Questionnaire    Feeling of Stress : Not at all  Social Connections: Moderately Integrated (10/26/2021)   Social Connection and Isolation Panel [NHANES]    Frequency of Communication with Friends and Family: More than three times a week    Frequency of Social Gatherings with Friends and Family: More than three times a week    Attends Religious Services: 1 to 4 times per year    Active Member of  [employer] or Organizations: Yes    Attends Banker Meetings: 1 to 4 times per year    Marital Status: Divorced  Catering manager Violence: Not At Risk (10/26/2021)   Humiliation, Afraid, Rape, and Kick questionnaire    Fear of Current or Ex-Partner: No    Emotionally Abused: No    Physically Abused: No    Sexually Abused: No    Outpatient Medications Prior to Visit  Medication Sig Dispense Refill   aspirin EC 325 MG tablet Take 325 mg by mouth as needed for mild pain. As directed     ezetimibe (ZETIA) 10 MG tablet Take 1 tablet (10 mg total) by mouth daily. 90 tablet 3   fludrocortisone (FLORINEF) 0.1 MG tablet Take 1 tablet (0.1 mg total) by mouth 2 (two) times daily. 180 tablet 1   OVER THE COUNTER MEDICATION as directed. Aerie's- otc for eyes.     No facility-administered medications prior to visit.    Allergies  Allergen Reactions   Atorvastatin Other (See Comments)    Muscle and joint pain.    Review of Systems  Cardiovascular:  Positive for chest pain.  Skin:        (-) [place]  Neurological:  Positive for weakness.       (+) "Figgity"/agitated sensation (+) Difficulty sleeping  Psychiatric/Behavioral:  The patient is nervous/anxious.        Objective:    Physical Exam Constitutional:      Appearance: Normal appearance. He is not ill-appearing.  HENT:     Head: Normocephalic and atraumatic.     Right Ear: External ear normal.     Left Ear: External ear normal.  Eyes:     Extraocular Movements: Extraocular movements intact.     Pupils: Pupils are equal, round, and reactive to light.  Cardiovascular:     Rate and Rhythm: Normal rate and regular rhythm.     Pulses: Normal pulses.     Heart sounds: Normal heart sounds. No murmur heard.    No gallop.  Pulmonary:     Effort: Pulmonary effort is normal. No respiratory distress.     Breath sounds: Normal breath sounds. No wheezing or rales.  Skin:    General: Skin is warm and dry.  Neurological:      Mental Status: He is alert and oriented to person, place, and time.  Psychiatric:        Judgment: Judgment normal.     BP (!) 150/86 (BP Location: Right Arm, Patient Position: Sitting, Cuff Size: Small)   Pulse Marland Kitchen)  58   Temp 98.4 F (36.9 C) (Oral)   Resp 16   Wt 177 lb (80.3 kg)   SpO2 100%   BMI 24.01 kg/m  Wt Readings from Last 3 Encounters:  07/04/22 177 lb (80.3 kg)  06/08/22 175 lb 9.6 oz (79.7 kg)  04/25/22 176 lb (79.8 kg)       Assessment & Plan:   Problem List Items Addressed This Visit       Unprioritized   Orthostatic hypotension    Improved on Fluorinef. BP a bit elevated today- monitor.       Fatigue - Primary    Will obtain labs as below for further medical evaluation. I have a high suspicion that anxiety may have played a roll in his episode at the beach. If recurrent symptoms could consider anxiety treatment.       Relevant Orders   Comp Met (CMET) (Completed)   CBC with Differential/Platelet (Completed)   TSH (Completed)   T3, free (Completed)   T4, free (Completed)   CK (Creatine Kinase) (Completed)     No orders of the defined types were placed in this encounter.   I, Lemont Fillers, NP, personally preformed the services described in this documentation.  All medical record entries made by the scribe were at my direction and in my presence.  I have reviewed the chart and discharge instructions (if applicable) and agree that the record reflects my personal performance and is accurate and complete. 07/04/2022   I,Tinashe Williams,acting as a scribe for Lemont Fillers, NP.,have documented all relevant documentation on the behalf of Lemont Fillers, NP,as directed by  Lemont Fillers, NP while in the presence of Lemont Fillers, NP.    Lemont Fillers, NP

## 2022-07-04 NOTE — Telephone Encounter (Signed)
Nurse Assessment Nurse: Roselyn Reef, RN, [place] Date/Time (Eastern Time): 07/03/2022 4:08:13 PM Confirm and document reason for call. If symptomatic, describe symptoms. ---Caller states his blood pressure has been high. He has not been feeling well. Unable to sleep, restless and is feeling weak. Symptoms started weekend after the dose medication taken today and feeling a bit better. States he was out of town this past weekend and didn't take his BP medications. Take Fludrocortisone for hypotension. twice a day 153/98 about a half hour ago. Does the patient have any new or worsening symptoms? ---Yes Will a triage be completed? ---Yes Related visit to physician within the last 2 weeks? ---No Does the PT have any chronic conditions? (i.e. diabetes, asthma, this includes High risk factors for pregnancy, etc.) ---Yes List chronic conditions. ---High cholesterol, hypotension Is this a behavioral health or substance abuse call? ---No Guidelines Guideline Title Affirmed Question Affirmed Notes Nurse Date/Time (Eastern Time) Weakness (Generalized) and Fatigue [1] MODERATE weakness (i.e., interferes with work, school, normal activities) AND [2] cause unknown Roselyn Reef, [place], Brayton Layman 07/03/2022 4:18:20 PM PLEASE NOTE: All timestamps contained within this report are represented as [place] Standard Time. CONFIDENTIALTY NOTICE: This fax transmission is intended only for the addressee. It contains information that is legally privileged, confidential or otherwise protected from use or disclosure. If you are not the intended recipient, you are strictly prohibited from reviewing, disclosing, copying using or disseminating any of this information or taking any action in reliance on or regarding this information. If you have received this fax in error, please notify us immediately by telephone so that we can arrange for its return to [place]. Phone: [phone], Toll-Free: [phone], Fax: [phone] Page: 2 of  2 Call Id: [id number] Guidelines Guideline Title Affirmed Question Affirmed Notes Nurse Date/Time Eilene Ghazi Time) (Exceptions: Weakness from acute minor illness or poor fluid intake; weakness is chronic and not worse.) Disp. Time Eilene Ghazi Time) Disposition Final User 07/03/2022 4:28:27 PM See HCP within 4 Hours (or PCP triage) Yes Roselyn Reef, [place], [facility] Final Disposition 07/03/2022 4:28:27 PM See HCP within 4 Hours (or PCP triage) Yes Roselyn Reef, RN, [place] Grandchild Disagree/Comply Comply Caller Understands Yes PreDisposition Call Doctor Care Advice Given Per Guideline SEE HCP (OR PCP TRIAGE) WITHIN 4 HOURS: * IF OFFICE WILL BE CLOSED AND NO PCP (PRIMARY CARE PROVIDER) SECOND-LEVEL TRIAGE: You need to be seen within the next 3 or 4 hours. A nearby Urgent Care Center [facility]) is often a good source of care. Another choice is to go to the ED. Go sooner if you become worse. * UCC: Some UCCs can manage patients who are stable and have less serious symptoms (e.g., minor illnesses and injuries). The triager must know the [facility] capabilities before sending a patient there. If unsure, call ahead. CALL BACK IF: * You become worse CARE ADVICE given per Weakness and Fatigue (Adult) guideline. Referrals GO TO FACILITY UNDECIDED

## 2022-07-04 NOTE — Telephone Encounter (Signed)
Followed up to further discuss. Pt reports it started Monday night, into Tuesday.  He was fidgety all over, couldn't sit/lay down, didn't sleep all night per pt. He was very worried and came home from vacation last night to see PCP this morning. States PCP told him it may have possibly been an anxiety attack, but unsure. Pt is feeling improved today, BP 150/86, HR 58.  No symptoms today. He is taking Florinef in the morning and again around 1/2 pm. He does admit he hasn't taken it BID daily and has missed doses here and there. Aware symptoms may have been r/t to body adjusting to the new medication (Florinef) and having some scattered doses. Pt will be more mindful to take regularly BID. Pt will let us know if reoccurs, but will keep monitoring for now.

## 2022-07-05 DIAGNOSIS — R5383 Other fatigue: Secondary | ICD-10-CM | POA: Insufficient documentation

## 2022-07-05 NOTE — Assessment & Plan Note (Signed)
Will obtain labs as below for further medical evaluation. I have a high suspicion that anxiety may have played a roll in his episode at the beach. If recurrent symptoms could consider anxiety treatment.

## 2022-07-05 NOTE — Assessment & Plan Note (Signed)
Improved on Fluorinef. BP a bit elevated today- monitor.

## 2022-07-06 ENCOUNTER — Telehealth: Payer: Self-pay

## 2022-07-06 ENCOUNTER — Telehealth: Payer: Self-pay | Admitting: Family

## 2022-07-06 DIAGNOSIS — R17 Unspecified jaundice: Secondary | ICD-10-CM

## 2022-07-06 DIAGNOSIS — Z8619 Personal history of other infectious and parasitic diseases: Secondary | ICD-10-CM

## 2022-07-06 MED ORDER — ESCITALOPRAM OXALATE 10 MG PO TABS: ORAL_TABLET | ORAL | 0 refills | Status: DC

## 2022-07-06 NOTE — Telephone Encounter (Signed)
See order.

## 2022-07-06 NOTE — Telephone Encounter (Signed)
I would like to give him a trial of an anxiety medication called lexapro. 1/2 tab once daily for 1 week at bedtime, then increase to a full tab once daily on week two. I will plan to see him back in August as scheduled.  Feel free to reach out in the meantime if symptoms worsen.  Lexapro can take up to a month to fully work.

## 2022-07-06 NOTE — Telephone Encounter (Signed)
Patient called to advise that he is still feeling jittery and has leg weakness. [name] told him to call if he was still feeling that way. HE would like to know if there is anything she can do for this or if there is anything else he should be doing. Please call him to advise.

## 2022-07-06 NOTE — Telephone Encounter (Signed)
Please advise pt that his kidneys, blood count and thyroid function look good.  His bilirubin, which is one of the liver tests, is elevated.  This is unlikely related to any of his recent symptoms. I would like for him to complete an abdominal ultrasound to look at his liver/gallbladder and I would also like for him to see GI given his previous Hx of Hep C.

## 2022-07-06 NOTE — Telephone Encounter (Signed)
Patient advised of results, referral and [place]. He agrees with plan of care.

## 2022-07-06 NOTE — Telephone Encounter (Signed)
Patient advised of provider's comments and he verbalized understanding.

## 2022-07-09 ENCOUNTER — Ambulatory Visit (HOSPITAL_BASED_OUTPATIENT_CLINIC_OR_DEPARTMENT_OTHER)
Admission: RE | Admit: 2022-07-09 | Discharge: 2022-07-09 | Disposition: A | Discharge status: Home / Self Care | Payer: Medicare Other | Source: Ambulatory Visit | Attending: Family | Admitting: Family

## 2022-07-09 ENCOUNTER — Telehealth: Payer: Self-pay | Admitting: Family

## 2022-07-09 DIAGNOSIS — N200 Calculus of kidney: Secondary | ICD-10-CM | POA: Diagnosis not present

## 2022-07-09 DIAGNOSIS — R17 Unspecified jaundice: Secondary | ICD-10-CM | POA: Diagnosis not present

## 2022-07-09 MED ORDER — GABAPENTIN 100 MG PO CAPS: 100.0000 mg | ORAL_CAPSULE | Freq: Three times a day (TID) | ORAL | 3 refills | Status: DC

## 2022-07-09 NOTE — Addendum Note (Signed)
Addended by: Debbrah Alar on: 07/09/2022 01:32 PM   Modules accepted: Orders

## 2022-07-09 NOTE — Telephone Encounter (Signed)
Spoke to pt. His primary complaint now is that he can't keep his legs still (day or night) and is having trouble sleeping. Will give trial of gabapentin.  We also reviewed the results of his adrenal insufficiency work up last year which was negative.

## 2022-07-09 NOTE — Telephone Encounter (Signed)
Pt stated he was following up with pcp regarding sxs. He is still very weak and it is hard to stand or keep his legs still. He also wakes up around 2-3am everyday and has a very hard time falling back asleep. He stated he is miserable day and night.

## 2022-07-10 ENCOUNTER — Telehealth: Payer: Self-pay | Admitting: Internal Medicine

## 2022-07-10 DIAGNOSIS — N200 Calculus of kidney: Secondary | ICD-10-CM

## 2022-07-10 HISTORY — DX: Calculus of kidney: N20.0

## 2022-07-10 NOTE — Telephone Encounter (Signed)
Pt called stating since he has changed medications, he hasn't slept in a week. He states that he feel like his legs are weak and fidgety. He states he just wanted to let them know that nothing has changed and he still feels the same as last week.

## 2022-07-11 ENCOUNTER — Encounter: Payer: Self-pay | Admitting: Family

## 2022-07-11 ENCOUNTER — Other Ambulatory Visit: Payer: Self-pay | Admitting: Gastroenterology

## 2022-07-11 DIAGNOSIS — K760 Fatty (change of) liver, not elsewhere classified: Secondary | ICD-10-CM | POA: Insufficient documentation

## 2022-07-13 NOTE — Telephone Encounter (Signed)
Returned call to Pt.  Pt has not tolerated Florinef.  It has caused disruption with his sleep.  He will stop today.  Pt previously did not tolerate midodrine-he states it made him urinate frequently and made his head tingle.  He will not restart midodrine.  At this time, will continue to monitor for improvement off Florinef.  Options going forward include Northera vs increasing salt/fluids.  Pt had question regarding medications prescribed by PCP (gabapentin and Lexapro).  Outreach made to PCP.  Per PCP continue these medications for now.  F/u with PCP already scheduled.  Will check back in on Pt on Monday to see if symptoms have resolved.

## 2022-07-13 NOTE — Telephone Encounter (Signed)
Left message to call back to discuss.

## 2022-07-16 NOTE — Telephone Encounter (Signed)
Outreach made to Pt.  He states that since stopping the Florinef he was able to sleep all night on Saturday and Sunday night.  Now he states he is back to feeling like he did before "a wet rag feeling".  He checked his BP on the phone with this nurse and it was 101/65.  We discussed his options going forward:  Northera vs increased salt/fluids.  He does not think he can get enough salt/fluids on his own.    Present concern is if he tries Northera, he is also on 2 new medications (lexapro and gabapentin) and does not want to have a reaction.  Plan:  He will continue Lexapro and gabapentin for now.  He will follow up with PCP 08/01/22 and discuss further.  Will follow up with Pt after PCP appointment to determine further plans (try Northera?)

## 2022-07-18 ENCOUNTER — Ambulatory Visit
Admission: RE | Admit: 2022-07-18 | Discharge: 2022-07-18 | Disposition: A | Discharge status: Home / Self Care | Payer: Medicare Other | Source: Ambulatory Visit | Attending: Gastroenterology | Admitting: Gastroenterology

## 2022-07-18 DIAGNOSIS — Z9049 Acquired absence of other specified parts of digestive tract: Secondary | ICD-10-CM | POA: Diagnosis not present

## 2022-08-01 ENCOUNTER — Ambulatory Visit (INDEPENDENT_AMBULATORY_CARE_PROVIDER_SITE_OTHER): Payer: Medicare Other | Admitting: Family

## 2022-08-01 VITALS — BP 107/63 | HR 65 | Temp 98.1°F | Resp 16 | Wt 174.0 lb

## 2022-08-01 DIAGNOSIS — I951 Orthostatic hypotension: Secondary | ICD-10-CM | POA: Diagnosis not present

## 2022-08-01 DIAGNOSIS — G2581 Restless legs syndrome: Secondary | ICD-10-CM | POA: Diagnosis not present

## 2022-08-01 DIAGNOSIS — R5382 Chronic fatigue, unspecified: Secondary | ICD-10-CM | POA: Diagnosis not present

## 2022-08-01 NOTE — Assessment & Plan Note (Signed)
Improved with addition of gabapentin '100mg'$  PO TID.  Continue same.

## 2022-08-01 NOTE — Assessment & Plan Note (Signed)
Ongoing.  One thing we have not checked thus far is an autoimmune panel. Will check labs as ordered.  I have encouraged him to seek a second opinion at this time as our evaluation thus far has been unrevealing. As he is not seeing much improvement on the lexapro, I advised him to decrease dose to 1/2 tab for 1 week and then stop. If he has recurrent episode off of lexapro such as the one he had at the beach, it would support diagnosis of panic attack and we could consider restarting.

## 2022-08-01 NOTE — Patient Instructions (Addendum)
Decrease lexapro to 1/2 tab x 1 week then stop.

## 2022-08-01 NOTE — Progress Notes (Signed)
Subjective:     Patient ID: Zachary Bard., male    DOB: 16-Sep-1951, 71 y.o.   MRN: [id number]  Chief Complaint  Patient presents with   Fatigue    Here for follow up, patient reports his fatigue symptoms are not better.     HPI  Patient is a 71 yr old male who presents today for follow up of his fatigue/hx of orthostatic hypotension.  Work up thus far has included negative cosyntropin stim test with endocrinology. He was seen by cardiology as well as EP who have also evaluated him. Cardiology started him on midodrine with only initial improvement in his symptoms. Cardiology stopped midodrine back in the end of June and he was instead placed on Florinef.  He stopped fluorinef on 8/4 as cardiology was concerned that his recent symptoms could be a side effect of the fluorinef.  No change in symptoms noted since he discontinued. Previous cardiac work ups include note of non-obstructive CAD (cardiac CT), 2d Echo noted grade 1 diastolic dysfunction, mild MR.  2 week zio patch noted short runs of SVT.   Last visit he described issues with sleep and possible RLS.  He had an episode at the beach where he had inability to sit still and felt extremely unsettled. We gave him a trial of gabapentin for RLS which seems to be helping, especially with his evening RLS symptoms.  He started lexapro '10mg'$  and notes no significant change in mood. He has not had any further episodes like the one at the beach.    Extreme fatigue and generalized weakness persist. He is getting very frustrated with how poorly he feels He notes that he feels worse in the AM and some better in the afternoon.  He expresses interest in obtaining a second opinion with another primary care provider who has been recommended to him by a friend.    Health Maintenance Due  Topic Date Due   Zoster Vaccines- Shingrix (1 of 2) Never done    Past Medical History:  Diagnosis Date   Fatty liver    GERD (gastroesophageal reflux disease)     History of hepatitis C    s/p curative therapy in the 90's.     Nephrolithiasis 07/2022   incidental finding on [place]   Orthostatic hypotension 06/15/2021   Overactive bladder     Past Surgical History:  Procedure Laterality Date   CHOLECYSTECTOMY  1992   TONSILLECTOMY      Family History  Problem Relation Age of Onset   CAD Father 72       CABG/Stent   Heart disease Father        CAD with CABG and stent   Ovarian cancer Sister    Alzheimer's disease Mother    Heart disease Paternal Uncle    Cancer Paternal Uncle        prostate cancer    Social History   Socioeconomic History   Marital status: Divorced    Spouse name: Not on file   Number of children: 2   Years of education: Not on file   Highest education level: Not on file  Occupational History   Occupation: realtor  Tobacco Use   Smoking status: Former    Types: Cigarettes    Quit date: 12/11/1975    Years since quitting: 46.6   Smokeless tobacco: Never  Vaping Use   Vaping Use: Never used  Substance and Sexual Activity   Alcohol use: Yes    Comment:  social   Drug use: No   Sexual activity: Yes  Other Topics Concern   Not on file  Social History Narrative   Lives alone.     Social Determinants of Health   Financial Resource Strain: Low Risk  (10/26/2021)   Overall Financial Resource Strain (CARDIA)    Difficulty of Paying Living Expenses: Not hard at all  Food Insecurity: No Food Insecurity (10/26/2021)   Hunger Vital Sign    Worried About Running Out of Food in the Last Year: Never true    Ran Out of Food in the Last Year: Never true  Transportation Needs: No Transportation Needs (10/26/2021)   PRAPARE - Hydrologist (Medical): No    Lack of Transportation (Non-Medical): No  Physical Activity: Sufficiently Active (10/26/2021)   Exercise Vital Sign    Days of Exercise per Week: 3 days    Minutes of Exercise per Session: 60 min  Stress: No Stress Concern Present  (10/26/2021)   [place]    Feeling of Stress : Not at all  Social Connections: Moderately Integrated (10/26/2021)   Social Connection and Isolation Panel [NHANES]    Frequency of Communication with Friends and Family: More than three times a week    Frequency of Social Gatherings with Friends and Family: More than three times a week    Attends Religious Services: 1 to 4 times per year    Active Member of [employer] or Organizations: Yes    Attends Archivist Meetings: 1 to 4 times per year    Marital Status: Divorced  Human resources officer Violence: Not At Risk (10/26/2021)   Humiliation, Afraid, Rape, and Kick questionnaire    Fear of Current or Ex-Partner: No    Emotionally Abused: No    Physically Abused: No    Sexually Abused: No    Outpatient Medications Prior to Visit  Medication Sig Dispense Refill   aspirin EC 325 MG tablet Take 325 mg by mouth as needed for mild pain. As directed     escitalopram (LEXAPRO) 10 MG tablet 1/2 tab by mouth once daily for 1 week, then increase to a full tab once daily on week two 30 tablet 0   ezetimibe (ZETIA) 10 MG tablet Take 1 tablet (10 mg total) by mouth daily. 90 tablet 3   gabapentin (NEURONTIN) 100 MG capsule Take 1 capsule (100 mg total) by mouth 3 (three) times daily. 90 capsule 3   OVER THE COUNTER MEDICATION as directed. Aerie's- otc for eyes.     No facility-administered medications prior to visit.    Allergies  Allergen Reactions   Atorvastatin Other (See Comments)    Muscle and joint pain.    ROS See HPI    Objective:    Physical Exam Constitutional:      General: He is not in acute distress.    Appearance: He is well-developed.  HENT:     Head: Normocephalic and atraumatic.  Cardiovascular:     Rate and Rhythm: Normal rate and regular rhythm.     Heart sounds: No murmur heard. Pulmonary:     Effort: Pulmonary effort is normal. No respiratory  distress.     Breath sounds: Normal breath sounds. No wheezing or rales.  Skin:    General: Skin is warm and dry.  Neurological:     General: No focal deficit present.     Mental Status: He is alert and oriented to  person, place, and time.  Psychiatric:        Mood and Affect: Mood normal.        Behavior: Behavior normal.        Thought Content: Thought content normal.     BP 107/63 (BP Location: Right Arm, Patient Position: Sitting, Cuff Size: Small)   Pulse 65   Temp 98.1 F (36.7 C) (Oral)   Resp 16   Wt 174 lb (78.9 kg)   SpO2 100%   BMI 23.60 kg/m  Wt Readings from Last 3 Encounters:  08/01/22 174 lb (78.9 kg)  07/04/22 177 lb (80.3 kg)  06/08/22 175 lb 9.6 oz (79.7 kg)       Assessment & Plan:   Problem List Items Addressed This Visit       Unprioritized   RLS (restless legs syndrome)    Improved with addition of gabapentin '100mg'$  PO TID.  Continue same.       Orthostatic hypotension    Continue liberal sodium in diet and good hydration.  He has declined compression stockings in the past.       Fatigue - Primary    Ongoing.  One thing we have not checked thus far is an autoimmune panel. Will check labs as ordered.  I have encouraged him to seek a second opinion at this time as our evaluation thus far has been unrevealing. As he is not seeing much improvement on the lexapro, I advised him to decrease dose to 1/2 tab for 1 week and then stop. If he has recurrent episode off of lexapro such as the one he had at the beach, it would support diagnosis of panic attack and we could consider restarting.       Relevant Orders   ANA   Rheumatoid Factor   Sedimentation rate   42 minutes spent on today's encounter. Time was spent counseling patient and carefully reviewing medical record.   I am having Zachary Lawrence. "[name]" maintain his aspirin EC, OVER THE COUNTER MEDICATION, ezetimibe, escitalopram, and gabapentin.  No orders of the defined types were placed in  this encounter.

## 2022-08-01 NOTE — Assessment & Plan Note (Signed)
Continue liberal sodium in diet and good hydration.  He has declined compression stockings in the past.

## 2022-08-03 DIAGNOSIS — N138 Other obstructive and reflux uropathy: Secondary | ICD-10-CM | POA: Diagnosis not present

## 2022-08-03 DIAGNOSIS — R3589 Other polyuria: Secondary | ICD-10-CM | POA: Diagnosis not present

## 2022-08-03 DIAGNOSIS — N401 Enlarged prostate with lower urinary tract symptoms: Secondary | ICD-10-CM | POA: Diagnosis not present

## 2022-08-06 ENCOUNTER — Telehealth: Payer: Self-pay | Admitting: Family

## 2022-08-06 NOTE — Telephone Encounter (Signed)
Looks like he forgot to stop by the lab on his way out. Please schedule lab visit.

## 2022-08-07 NOTE — Telephone Encounter (Signed)
Spoke with pt. Advised him we have no record of him stopping in the lab on the 23rd.  Pt states he may have been confused with his previous visit and is glad to schedule lab appt.  Appt made for 8/31 @ 9:30 and future lab orders placed.

## 2022-08-07 NOTE — Addendum Note (Signed)
Addended by: Kelle Darting A on: 08/07/2022 10:52 AM   Modules accepted: Orders

## 2022-08-09 ENCOUNTER — Other Ambulatory Visit (INDEPENDENT_AMBULATORY_CARE_PROVIDER_SITE_OTHER): Payer: Medicare Other

## 2022-08-09 DIAGNOSIS — R5382 Chronic fatigue, unspecified: Secondary | ICD-10-CM | POA: Diagnosis not present

## 2022-08-09 LAB — SEDIMENTATION RATE: Sed Rate: 1 mm/hr (ref 0–20)

## 2022-08-11 LAB — ANTI-NUCLEAR AB-TITER (ANA TITER)
ANA TITER: 1:320 {titer} — ABNORMAL HIGH
ANA Titer 1: 1:1280 {titer} — ABNORMAL HIGH

## 2022-08-11 LAB — ANA: Anti Nuclear Antibody (ANA): POSITIVE — AB

## 2022-08-11 LAB — RHEUMATOID FACTOR: Rheumatoid fact SerPl-aCnc: <14 IU/mL (ref <14)

## 2022-08-12 ENCOUNTER — Telehealth: Payer: Self-pay | Admitting: Family

## 2022-08-12 DIAGNOSIS — R768 Other specified abnormal immunological findings in serum: Secondary | ICD-10-CM

## 2022-08-12 NOTE — Telephone Encounter (Signed)
See mychart.  

## 2022-08-21 DIAGNOSIS — E785 Hyperlipidemia, unspecified: Secondary | ICD-10-CM | POA: Diagnosis not present

## 2022-08-22 LAB — HEPATIC FUNCTION PANEL
ALT: 13 IU/L (ref 0–44)
AST: 18 IU/L (ref 0–40)
Albumin: 4.5 g/dL (ref 3.8–4.8)
Alkaline Phosphatase: 59 IU/L (ref 44–121)
Bilirubin Total: 1.4 mg/dL — ABNORMAL HIGH (ref 0.0–1.2)
Bilirubin, Direct: 0.27 mg/dL (ref 0.00–0.40)
Total Protein: 6.4 g/dL (ref 6.0–8.5)

## 2022-08-22 LAB — LIPID PANEL
Chol/HDL Ratio: 2.6 ratio (ref 0.0–5.0)
Cholesterol, Total: 177 mg/dL (ref 100–199)
HDL: 67 mg/dL (ref >39)
LDL Chol Calc (NIH): 100 mg/dL — ABNORMAL HIGH (ref 0–99)
Triglycerides: 53 mg/dL (ref 0–149)
VLDL Cholesterol Cal: 10 mg/dL (ref 5–40)

## 2022-09-01 ENCOUNTER — Other Ambulatory Visit: Payer: Self-pay | Admitting: Family

## 2022-09-04 ENCOUNTER — Other Ambulatory Visit: Payer: Self-pay | Admitting: *Deleted

## 2022-09-04 DIAGNOSIS — E785 Hyperlipidemia, unspecified: Secondary | ICD-10-CM

## 2022-09-04 DIAGNOSIS — Z961 Presence of intraocular lens: Secondary | ICD-10-CM | POA: Diagnosis not present

## 2022-09-04 DIAGNOSIS — H353121 Nonexudative age-related macular degeneration, left eye, early dry stage: Secondary | ICD-10-CM | POA: Diagnosis not present

## 2022-09-04 MED ORDER — ROSUVASTATIN CALCIUM 10 MG PO TABS: 10.0000 mg | ORAL_TABLET | Freq: Every day | ORAL | 3 refills | Status: DC

## 2022-09-05 DIAGNOSIS — M2550 Pain in unspecified joint: Secondary | ICD-10-CM | POA: Diagnosis not present

## 2022-09-05 DIAGNOSIS — Z1389 Encounter for screening for other disorder: Secondary | ICD-10-CM | POA: Diagnosis not present

## 2022-09-05 DIAGNOSIS — G729 Myopathy, unspecified: Secondary | ICD-10-CM | POA: Diagnosis not present

## 2022-09-05 DIAGNOSIS — R5383 Other fatigue: Secondary | ICD-10-CM | POA: Diagnosis not present

## 2022-09-05 DIAGNOSIS — E271 Primary adrenocortical insufficiency: Secondary | ICD-10-CM | POA: Diagnosis not present

## 2022-09-05 DIAGNOSIS — D508 Other iron deficiency anemias: Secondary | ICD-10-CM | POA: Diagnosis not present

## 2022-09-05 DIAGNOSIS — E039 Hypothyroidism, unspecified: Secondary | ICD-10-CM | POA: Diagnosis not present

## 2022-09-05 DIAGNOSIS — R790 Abnormal level of blood mineral: Secondary | ICD-10-CM | POA: Diagnosis not present

## 2022-09-05 DIAGNOSIS — E559 Vitamin D deficiency, unspecified: Secondary | ICD-10-CM | POA: Diagnosis not present

## 2022-09-05 DIAGNOSIS — A6920 Lyme disease, unspecified: Secondary | ICD-10-CM | POA: Diagnosis not present

## 2022-09-05 DIAGNOSIS — I951 Orthostatic hypotension: Secondary | ICD-10-CM | POA: Diagnosis not present

## 2022-09-05 DIAGNOSIS — E291 Testicular hypofunction: Secondary | ICD-10-CM | POA: Diagnosis not present

## 2022-09-24 DIAGNOSIS — E271 Primary adrenocortical insufficiency: Secondary | ICD-10-CM | POA: Diagnosis not present

## 2022-09-24 DIAGNOSIS — E291 Testicular hypofunction: Secondary | ICD-10-CM | POA: Diagnosis not present

## 2022-09-24 DIAGNOSIS — D510 Vitamin B12 deficiency anemia due to intrinsic factor deficiency: Secondary | ICD-10-CM | POA: Diagnosis not present

## 2022-09-24 DIAGNOSIS — G729 Myopathy, unspecified: Secondary | ICD-10-CM | POA: Diagnosis not present

## 2022-10-09 ENCOUNTER — Ambulatory Visit: Payer: Medicare Other | Admitting: Physician Assistant

## 2022-10-09 DIAGNOSIS — E271 Primary adrenocortical insufficiency: Secondary | ICD-10-CM | POA: Diagnosis not present

## 2022-10-09 DIAGNOSIS — E291 Testicular hypofunction: Secondary | ICD-10-CM | POA: Diagnosis not present

## 2022-10-09 DIAGNOSIS — G729 Myopathy, unspecified: Secondary | ICD-10-CM | POA: Diagnosis not present

## 2022-10-09 DIAGNOSIS — D510 Vitamin B12 deficiency anemia due to intrinsic factor deficiency: Secondary | ICD-10-CM | POA: Diagnosis not present

## 2022-10-10 DIAGNOSIS — G729 Myopathy, unspecified: Secondary | ICD-10-CM | POA: Diagnosis not present

## 2022-10-10 DIAGNOSIS — E291 Testicular hypofunction: Secondary | ICD-10-CM | POA: Diagnosis not present

## 2022-10-10 DIAGNOSIS — E271 Primary adrenocortical insufficiency: Secondary | ICD-10-CM | POA: Diagnosis not present

## 2022-10-10 DIAGNOSIS — D510 Vitamin B12 deficiency anemia due to intrinsic factor deficiency: Secondary | ICD-10-CM | POA: Diagnosis not present

## 2022-10-23 DIAGNOSIS — E271 Primary adrenocortical insufficiency: Secondary | ICD-10-CM | POA: Diagnosis not present

## 2022-10-23 DIAGNOSIS — D510 Vitamin B12 deficiency anemia due to intrinsic factor deficiency: Secondary | ICD-10-CM | POA: Diagnosis not present

## 2022-10-23 DIAGNOSIS — G729 Myopathy, unspecified: Secondary | ICD-10-CM | POA: Diagnosis not present

## 2022-10-23 DIAGNOSIS — E291 Testicular hypofunction: Secondary | ICD-10-CM | POA: Diagnosis not present

## 2022-10-29 ENCOUNTER — Ambulatory Visit (INDEPENDENT_AMBULATORY_CARE_PROVIDER_SITE_OTHER): Payer: Medicare Other | Admitting: *Deleted

## 2022-10-29 VITALS — BP 122/69 | HR 75 | Ht 72.0 in | Wt 182.8 lb

## 2022-10-29 DIAGNOSIS — Z0000 Encounter for general adult medical examination without abnormal findings: Secondary | ICD-10-CM | POA: Diagnosis not present

## 2022-10-29 NOTE — Progress Notes (Signed)
Subjective:   Zachary Lawrence. is a 71 y.o. male who presents for Medicare Annual/Subsequent preventive examination.  Review of Systems    Defer to PCP Cardiac Risk Factors include: advanced age (>57mn, >>51women);male gender;dyslipidemia     Objective:    Today's Vitals   10/29/22 1012  BP: 122/69  Pulse: 75  Weight: 182 lb 12.8 oz (82.9 kg)  Height: 6' (1.829 m)   Body mass index is 24.79 kg/m.     10/29/2022   10:25 AM 02/23/2022   11:20 AM 10/26/2021    9:42 AM 08/31/2021    5:17 PM 04/27/2020    9:20 AM 02/26/2018    9:26 AM 01/26/2015   12:12 PM  Advanced Directives  Does Patient Have a Medical Advance Directive? Yes Yes Yes Yes No No No  Type of [place]amed[place]of [place] will Living will;Healthcare Power of [place]      Copy of HRockledgein Chart? No - copy requested  No - copy requested      Would patient like information on creating a medical advance directive?     No - Patient declined Yes (MAU/Ambulatory/Procedural Areas - Information given) No - patient declined information    Current Medications (verified) Outpatient Encounter Medications as of 10/29/2022  Medication Sig   testosterone cypionate (DEPOTESTOSTERONE CYPIONATE) 200 MG/ML injection Inject 200 mg into the muscle every 14 (fourteen) days.   aspirin EC 325 MG tablet Take 325 mg by mouth as needed for mild pain. As directed   OVER THE COUNTER MEDICATION as directed. Aerie's- otc for eyes.   [DISCONTINUED] escitalopram (LEXAPRO) 10 MG tablet Take 1 tablet (10 mg total) by mouth daily.   [DISCONTINUED] ezetimibe (ZETIA) 10 MG tablet Take 1 tablet (10 mg total) by mouth daily.   [DISCONTINUED] gabapentin (NEURONTIN) 100 MG capsule Take 1 capsule (100 mg total) by mouth 3 (three) times daily.   [DISCONTINUED] rosuvastatin (CRESTOR) 10 MG tablet Take 1 tablet (10 mg total) by mouth daily.   No facility-administered  encounter medications on file as of 10/29/2022.    Allergies (verified) Atorvastatin   History: Past Medical History:  Diagnosis Date   Fatty liver    GERD (gastroesophageal reflux disease)    History of hepatitis C    s/p curative therapy in the 90's.     Nephrolithiasis 07/2022   incidental finding on [place]  Orthostatic hypotension 06/15/2021   Overactive bladder    Past Surgical History:  Procedure Laterality Date   CHOLECYSTECTOMY  1992   TONSILLECTOMY     Family History  Problem Relation Age of Onset   CAD Father 612      CABG/Stent   Heart disease Father        CAD with CABG and stent   Ovarian cancer Sister    Alzheimer's disease Mother    Heart disease Paternal Uncle    Cancer Paternal Uncle        prostate cancer   Social History   Socioeconomic History   Marital status: Divorced    Spouse name: Not on file   Number of children: 2   Years of education: Not on file   Highest education level: Not on file  Occupational History   Occupation: realtor  Tobacco Use   Smoking status: Former    Types: Cigarettes    Quit date: 12/11/1975    Years since quitting: 46.9   Smokeless  tobacco: Never  Vaping Use   Vaping Use: Never used  Substance and Sexual Activity   Alcohol use: Yes    Comment: social   Drug use: No   Sexual activity: Yes  Other Topics Concern   Not on file  Social History Narrative   Lives alone.     Social Determinants of Health   Financial Resource Strain: Low Risk  (10/26/2021)   Overall Financial Resource Strain (CARDIA)    Difficulty of Paying Living Expenses: Not hard at all  Food Insecurity: No Food Insecurity (10/29/2022)   Hunger Vital Sign    Worried About Running Out of Food in the Last Year: Never true    Ran Out of Food in the Last Year: Never true  Transportation Needs: No Transportation Needs (10/29/2022)   PRAPARE - Hydrologist (Medical): No    Lack of Transportation (Non-Medical): No   Physical Activity: Sufficiently Active (10/26/2021)   Exercise Vital Sign    Days of Exercise per Week: 3 days    Minutes of Exercise per Session: 60 min  Stress: No Stress Concern Present (10/26/2021)   [place]    Feeling of Stress : Not at all  Social Connections: Moderately Integrated (10/26/2021)   Social Connection and Isolation Panel [NHANES]    Frequency of Communication with Friends and Family: More than three times a week    Frequency of Social Gatherings with Friends and Family: More than three times a week    Attends Religious Services: 1 to 4 times per year    Active Member of [employer] or Organizations: Yes    Attends Archivist Meetings: 1 to 4 times per year    Marital Status: Divorced    Tobacco Counseling Counseling given: Not Answered   Clinical Intake:  Pre-visit preparation completed: Yes  Pain : No/denies pain  Diabetes: No  How often do you need to have someone help you when you read instructions, pamphlets, or other written materials from your doctor or pharmacy?: 1 - Never  Activities of Daily Living    10/29/2022   10:26 AM  In your present state of health, do you have any difficulty performing the following activities:  Hearing? 0  Vision? 0  Difficulty concentrating or making decisions? 0  Walking or climbing stairs? 0  Dressing or bathing? 0  Doing errands, shopping? 0  Preparing Food and eating ? N  Using the Toilet? N  In the past six months, have you accidently leaked urine? N  Do you have problems with loss of bowel control? N  Managing your Medications? N  Managing your Finances? N  Housekeeping or managing your Housekeeping? N    Patient Care Team: Debbrah Alar, NP as PCP - General (Internal Medicine) Ronald Lobo, MD as Consulting Physician (Gastroenterology)  Indicate any recent Medical Services you may have received from other than [facility]  providers in the past year (date may be approximate).     Assessment:   This is a routine wellness examination for [facility].  Hearing/Vision screen No results found.  Dietary issues and exercise activities discussed: Current Exercise Habits: Home exercise routine, Type of exercise: strength training/weights;treadmill, Time (Minutes): 60, Frequency (Times/Week): 3, Weekly Exercise (Minutes/Week): 180, Intensity: Mild, Exercise limited by: None identified   Goals Addressed   None    Depression Screen    10/29/2022   10:23 AM 10/26/2021    9:49 AM 06/14/2021  9:31 AM 04/27/2020    9:02 AM 01/14/2019   11:10 AM 02/26/2018    9:28 AM 11/15/2017    8:33 AM  PHQ 2/9 Scores  PHQ - 2 Score 0 0 0 0 0 0 0    Fall Risk    10/29/2022   10:24 AM 10/26/2021    9:47 AM 06/14/2021    9:31 AM 04/27/2020    9:02 AM 01/14/2019   11:10 AM  Fall Risk   Falls in the past year? 0 0 0 0 0  Number falls in past yr: 0 0 0 0   Injury with Fall? 0 0 0 0   Risk for fall due to : No Fall Risks      Follow up Falls evaluation completed Falls prevention discussed  Education provided;Falls prevention discussed     FALL RISK PREVENTION PERTAINING TO THE HOME:  Any stairs in or around the home? Yes  If so, are there any without handrails? No  Home free of loose throw rugs in walkways, pet beds, electrical cords, etc? Yes  Adequate lighting in your home to reduce risk of falls? Yes   ASSISTIVE DEVICES UTILIZED TO PREVENT FALLS:  Life alert? No  Use of a cane, walker or w/c? No  Grab bars in the bathroom? No  Shower chair or bench in shower? Yes  Elevated toilet seat or a handicapped toilet?  Comfort height  TIMED UP AND GO:  Was the test performed? Yes .  Length of time to ambulate 10 feet: 5 sec.   Gait steady and fast without use of assistive device  Cognitive Function:        10/29/2022   10:32 AM  6CIT Screen  What Year? 0 points  What month? 0 points  What time? 0 points  Count back  from 20 0 points  Months in reverse 0 points  Repeat phrase 0 points  Total Score 0 points    Immunizations Immunization History  Administered Date(s) Administered   PNEUMOCOCCAL CONJUGATE-20 06/14/2021   Tdap 11/28/2009, 04/18/2018    TDAP status: Up to date  Flu Vaccine status: Declined, Education has been provided regarding the importance of this vaccine but patient still declined. Advised may receive this vaccine at local pharmacy or Health Dept. Aware to provide a copy of the vaccination record if obtained from local pharmacy or Health Dept. Verbalized acceptance and understanding.  Pneumococcal vaccine status: Up to date  Covid-19 vaccine status: Information provided on how to obtain vaccines.   Qualifies for Shingles Vaccine? Yes   Zostavax completed No   Shingrix Completed?: No.    Education has been provided regarding the importance of this vaccine. Patient has been advised to call insurance company to determine out of pocket expense if they have not yet received this vaccine. Advised may also receive vaccine at local pharmacy or Health Dept. Verbalized acceptance and understanding.  Screening Tests Health Maintenance  Topic Date Due   Zoster Vaccines- Shingrix (1 of 2) Never done   Medicare Annual Wellness (AWV)  10/26/2022   COLONOSCOPY (Pts 45-51yr Insurance coverage will need to be confirmed)  06/09/2028   Pneumonia Vaccine 71 Years old  Completed   Hepatitis C Screening  Completed   HPV VACCINES  Aged Out   INFLUENZA VACCINE  Discontinued   COVID-19 Vaccine  Discontinued    Health Maintenance  Health Maintenance Due  Topic Date Due   Zoster Vaccines- Shingrix (1 of 2) Never done   Medicare Annual  Wellness (AWV)  10/26/2022    Colorectal cancer screening: Type of screening: Colonoscopy. Completed 06/09/18. Repeat every 10 years  Lung Cancer Screening: (Low Dose CT Chest recommended if Age 26-80 years, 30 pack-year currently smoking OR have quit w/in  15years.) does not qualify.    Additional Screening:  Hepatitis C Screening: does qualify; Completed 07/06/22  Vision Screening: Recommended annual ophthalmology exams for early detection of glaucoma and other disorders of the eye. Is the patient up to date with their annual eye exam?  Yes  Who is the provider or what is the name of the office in which the patient attends annual eye exams? [facility] If pt is not established with a provider, would they like to be referred to a provider to establish care? No .   Dental Screening: Recommended annual dental exams for proper oral hygiene  Community Resource Referral / Chronic Care Management: CRR required this visit?  No   CCM required this visit?  No      Plan:     I have personally reviewed and noted the following in the patient's chart:   Medical and social history Use of alcohol, tobacco or illicit drugs  Current medications and supplements including opioid prescriptions. Patient is not currently taking opioid prescriptions. Functional ability and status Nutritional status Physical activity Advanced directives List of other physicians Hospitalizations, surgeries, and ER visits in previous 12 months Vitals Screenings to include cognitive, depression, and falls Referrals and appointments  In addition, I have reviewed and discussed with patient certain preventive protocols, quality metrics, and best practice recommendations. A written personalized care plan for preventive services as well as general preventive health recommendations were provided to patient.     Beatris Ship, [place]   10/29/2022   Nurse Notes: None

## 2022-10-29 NOTE — Patient Instructions (Signed)
[name] , Thank you for taking time to come for your Medicare Wellness Visit. I appreciate your ongoing commitment to your health goals. Please review the following plan we discussed and let me know if I can assist you in the future.   These are the goals we discussed:  Goals       DIET - INCREASE WATER INTAKE      Maintain healthy active lifestyle. (pt-stated)        This is a list of the screening recommended for you and due dates:  Health Maintenance  Topic Date Due   Zoster (Shingles) Vaccine (1 of 2) Never done   Medicare Annual Wellness Visit  10/30/2023   Colon Cancer Screening  06/09/2028   Pneumonia Vaccine  Completed   Hepatitis C Screening: USPSTF Recommendation to screen - Ages 18-79 yo.  Completed   HPV Vaccine  Aged Out   Flu Shot  Discontinued   COVID-19 Vaccine  Discontinued     Next appointment: Follow up in one year for your annual wellness visit.   Preventive Care 71 Years and Older, Male Preventive care refers to lifestyle choices and visits with your health care provider that can promote health and wellness. What does preventive care include? A yearly physical exam. This is also called an annual well check. Dental exams once or twice a year. Routine eye exams. Ask your health care provider how often you should have your eyes checked. Personal lifestyle choices, including: Daily care of your teeth and gums. Regular physical activity. Eating a healthy diet. Avoiding tobacco and drug use. Limiting alcohol use. Practicing safe sex. Taking low doses of aspirin every day. Taking vitamin and mineral supplements as recommended by your health care provider. What happens during an annual well check? The services and screenings done by your health care provider during your annual well check will depend on your age, overall health, lifestyle risk factors, and family history of disease. Counseling  Your health care provider may ask you questions about  your: Alcohol use. Tobacco use. Drug use. Emotional well-being. Home and relationship well-being. Sexual activity. Eating habits. History of falls. Memory and ability to understand (cognition). Work and work Statistician. Screening  You may have the following tests or measurements: Height, weight, and BMI. Blood pressure. Lipid and cholesterol levels. These may be checked every 5 years, or more frequently if you are over 25 years old. Skin check. Lung cancer screening. You may have this screening every year starting at age 52 if you have a 30-pack-year history of smoking and currently smoke or have quit within the past 15 years. Fecal occult blood test (FOBT) of the stool. You may have this test every year starting at age 42. Flexible sigmoidoscopy or colonoscopy. You may have a sigmoidoscopy every 5 years or a colonoscopy every 10 years starting at age 48. Prostate cancer screening. Recommendations will vary depending on your family history and other risks. Hepatitis C blood test. Hepatitis B blood test. Sexually transmitted disease (STD) testing. Diabetes screening. This is done by checking your blood sugar (glucose) after you have not eaten for a while (fasting). You may have this done every 1-3 years. Abdominal aortic aneurysm (AAA) screening. You may need this if you are a current or former smoker. Osteoporosis. You may be screened starting at age 49 if you are at high risk. Talk with your health care provider about your test results, treatment options, and if necessary, the need for more tests. Vaccines  Your  health care provider may recommend certain vaccines, such as: Influenza vaccine. This is recommended every year. Tetanus, diphtheria, and acellular pertussis (Tdap, Td) vaccine. You may need a Td booster every 10 years. Zoster vaccine. You may need this after age 71. Pneumococcal 13-valent conjugate (PCV13) vaccine. One dose is recommended after age 53. Pneumococcal  polysaccharide (PPSV23) vaccine. One dose is recommended after age 4. Talk to your health care provider about which screenings and vaccines you need and how often you need them. This information is not intended to replace advice given to you by your health care provider. Make sure you discuss any questions you have with your health care provider. Document Released: 12/23/2015 Document Revised: 08/15/2016 Document Reviewed: 09/27/2015 Elsevier Interactive Patient Education  2017 [employer] Prevention in the Home Falls can cause injuries. They can happen to people of all ages. There are many things you can do to make your home safe and to help prevent falls. What can I do on the outside of my home? Regularly fix the edges of walkways and driveways and fix any cracks. Remove anything that might make you trip as you walk through a door, such as a raised step or threshold. Trim any bushes or trees on the path to your home. Use bright outdoor lighting. Clear any walking paths of anything that might make someone trip, such as rocks or tools. Regularly check to see if handrails are loose or broken. Make sure that both sides of any steps have handrails. Any raised decks and porches should have guardrails on the edges. Have any leaves, snow, or ice cleared regularly. Use sand or salt on walking paths during winter. Clean up any spills in your garage right away. This includes oil or grease spills. What can I do in the bathroom? Use night lights. Install grab bars by the toilet and in the tub and shower. Do not use towel bars as grab bars. Use non-skid mats or decals in the tub or shower. If you need to sit down in the shower, use a plastic, non-slip stool. Keep the floor dry. Clean up any water that spills on the floor as soon as it happens. Remove soap buildup in the tub or shower regularly. Attach bath mats securely with double-sided non-slip rug tape. Do not have throw rugs and other  things on the floor that can make you trip. What can I do in the bedroom? Use night lights. Make sure that you have a light by your bed that is easy to reach. Do not use any sheets or blankets that are too big for your bed. They should not hang down onto the floor. Have a firm chair that has side arms. You can use this for support while you get dressed. Do not have throw rugs and other things on the floor that can make you trip. What can I do in the kitchen? Clean up any spills right away. Avoid walking on wet floors. Keep items that you use a lot in easy-to-reach places. If you need to reach something above you, use a strong step stool that has a grab bar. Keep electrical cords out of the way. Do not use floor polish or wax that makes floors slippery. If you must use wax, use non-skid floor wax. Do not have throw rugs and other things on the floor that can make you trip. What can I do with my stairs? Do not leave any items on the stairs. Make sure that there are handrails  on both sides of the stairs and use them. Fix handrails that are broken or loose. Make sure that handrails are as long as the stairways. Check any carpeting to make sure that it is firmly attached to the stairs. Fix any carpet that is loose or worn. Avoid having throw rugs at the top or bottom of the stairs. If you do have throw rugs, attach them to the floor with carpet tape. Make sure that you have a light switch at the top of the stairs and the bottom of the stairs. If you do not have them, ask someone to add them for you. What else can I do to help prevent falls? Wear shoes that: Do not have high heels. Have rubber bottoms. Are comfortable and fit you well. Are closed at the toe. Do not wear sandals. If you use a stepladder: Make sure that it is fully opened. Do not climb a closed stepladder. Make sure that both sides of the stepladder are locked into place. Ask someone to hold it for you, if possible. Clearly  mark and make sure that you can see: Any grab bars or handrails. First and last steps. Where the edge of each step is. Use tools that help you move around (mobility aids) if they are needed. These include: Canes. Walkers. Scooters. Crutches. Turn on the lights when you go into a dark area. Replace any light bulbs as soon as they burn out. Set up your furniture so you have a clear path. Avoid moving your furniture around. If any of your floors are uneven, fix them. If there are any pets around you, be aware of where they are. Review your medicines with your doctor. Some medicines can make you feel dizzy. This can increase your chance of falling. Ask your doctor what other things that you can do to help prevent falls. This information is not intended to replace advice given to you by your health care provider. Make sure you discuss any questions you have with your health care provider. Document Released: 09/22/2009 Document Revised: 05/03/2016 Document Reviewed: 12/31/2014 Elsevier Interactive Patient Education  2017 [employer].

## 2022-10-30 NOTE — Progress Notes (Unsigned)
Office Visit Note  Patient: Zachary Lawrence.             Date of Birth: [birth year]           MRN: [id number]             PCP: Debbrah Alar, NP Referring: Debbrah Alar, NP Visit Date: 10/31/2022 Occupation: Realtor  Subjective:  New Patient (Initial Visit) Sunday [place] headed)   History of Present Illness: Carolyn Maniscalco. is a 71 y.o. male here for evaluation of positive ANA with chronic fatigue. He has a medical history of chronic hepatitis C treated in the 90s and orthostatic hypotension without other known coronary disease. Fatigue symptoms started since 2017 he calls first experiencing severe fatigue after dragging a log on his property and since then worsening exertion intolerance and some shortness of breath with activity. He has found this debilitating at times. Does experience muscle soreness some of the time usually after increased exertion. He does not see significant peripheral swelling. No coughing or specific pulmonary symptoms. He has had considerable number of evaluations. Most recently also saw another specialist with workup showing low testosterone levels he thinks level was 20s and started [place] injection replacement for this, so far 3 total doses. Iron levels were apparently also high.  He had previous liver biopsy without significant abnormality just fatty liver disease. Function tests have been normal besides chronic mild bilirubin elevation. He has mild chronic facial rash with dry skin and actinic keratoses for which he sees dermatology.   Labs reviewed 07/2022 ANA 1:1280 rods and rings 1:320 speckled RF neg ESR 1 CK 123 TSH T3 T4 wnl CBC Plts 145  Activities of Daily Living:  Patient reports morning stiffness for 2 minutes.   Patient Denies nocturnal pain.  Difficulty dressing/grooming: Denies Difficulty climbing stairs: Denies Difficulty getting out of chair: Denies Difficulty using hands for taps, buttons, cutlery, and/or writing: Reports  Review  of Systems  Constitutional:  Positive for fatigue.  HENT:  Positive for mouth dryness. Negative for mouth sores.   Eyes:  Negative for dryness.  Respiratory:  Positive for shortness of breath.   Cardiovascular:  Negative for chest pain and palpitations.  Gastrointestinal:  Negative for blood in stool, constipation and diarrhea.  Endocrine: Negative for increased urination.  Genitourinary:  Negative for involuntary urination.  Musculoskeletal:  Positive for morning stiffness. Negative for joint pain, gait problem, joint pain, joint swelling, myalgias, muscle weakness, muscle tenderness and myalgias.  Skin:  Negative for color change, rash, hair loss and sensitivity to sunlight.  Allergic/Immunologic: Negative for susceptible to infections.  Neurological:  Positive for dizziness. Negative for headaches.  Hematological:  Negative for swollen glands.  Psychiatric/Behavioral:  Negative for depressed mood and sleep disturbance. The patient is not nervous/anxious.     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   COVID-19 virus infection 02/20/2022   Unvaccinated for covid-19 02/20/2022   Orthostatic hypotension 06/15/2021   History of hepatitis C    Hyperlipidemia 10/11/2020   Rhus dermatitis 03/19/2019   Vertigo 03/06/2016   Dyspnea on exertion 03/06/2016   Benign non-nodular prostatic hyperplasia with lower urinary tract symptoms 02/09/2016   History of acute hepatitis 09/09/2014   Esophageal reflux 09/09/2014   Allergic rhinitis 09/09/2014   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     Nephrolithiasis 07/2022   incidental finding on  [place]   Orthostatic hypotension 06/15/2021   Overactive bladder     Family History  Problem Relation Age of Onset   Alzheimer's disease Mother    CAD Father 57       CABG/Stent   Heart disease Father        CAD with CABG and stent   Ovarian cancer Sister    Heart disease Paternal Uncle    Cancer Paternal Uncle        prostate cancer   Past Surgical History:  Procedure Laterality Date   CATARACT EXTRACTION Bilateral    CHOLECYSTECTOMY  1992   TONSILLECTOMY     Social History   Social History Narrative   Lives alone.     Immunization History  Administered Date(s) Administered   PNEUMOCOCCAL CONJUGATE-20 06/14/2021   Tdap 11/28/2009, 04/18/2018     Objective: Vital Signs: BP 110/70 (BP Location: Right Arm, Patient Position: Sitting, Cuff Size: Normal)   Pulse 67   Resp 16   Ht _0  (1.778 m)   Wt 182 lb (82.6 kg)   BMI 26.11 kg/m    Physical Exam HENT:     Right Ear: External ear normal.     Left Ear: External ear normal.     Mouth/Throat:     Mouth: Mucous membranes are moist.     Pharynx: Oropharynx is clear.  Eyes:     Conjunctiva/sclera: Conjunctivae normal.  Cardiovascular:     Rate and Rhythm: Normal rate and regular rhythm.     Comments: Tortuous superficial veins on lower extremities, no edema or erythema Pulmonary:     Effort: Pulmonary effort is normal.     Breath sounds: Normal breath sounds.  Musculoskeletal:     Right lower leg: No edema.     Left lower leg: No edema.  Lymphadenopathy:     Cervical: No cervical adenopathy.  Skin:    General: Skin is warm and dry.     Findings: No rash.     Comments: Forearm extensor surface bruising present  Neurological:     Mental Status: He is alert.  Psychiatric:        Mood and Affect: Mood normal.      Musculoskeletal Exam:  Shoulders full ROM no tenderness or swelling Elbows full ROM no tenderness or swelling Wrists full ROM no tenderness or swelling Fingers full ROM no tenderness or  swelling Knees full ROM no tenderness or swelling  Investigation: No additional findings.  Imaging: No results found.  Recent Labs: Lab Results  Component Value Date   WBC 5.0 07/04/2022   HGB 13.6 07/04/2022   PLT 145.0 (L) 07/04/2022   NA 142 07/04/2022   K 3.5 07/04/2022   CL 106 07/04/2022   CO2 28 07/04/2022   GLUCOSE 96 07/04/2022   BUN 15 07/04/2022   CREATININE 0.94 07/04/2022   BILITOT 1.4 (H) 08/21/2022   ALKPHOS 59 08/21/2022   AST 18 08/21/2022   ALT 13 08/21/2022   PROT 6.4 08/21/2022   ALBUMIN 4.5 08/21/2022   CALCIUM 9.2 07/04/2022   GFRAA 69 05/31/2020    Speciality Comments: No specialty comments available.  Procedures:  No procedures performed Allergies: Atorvastatin   Assessment / Plan:  Visit Diagnoses: Positive ANA (antinuclear antibody) - Plan: Anti-scleroderma antibody, RNP Antibody, Anti-Smith antibody, Sjogrens syndrome-A extractable nuclear antibody, Sjogrens syndrome-B extractable nuclear antibody, Anti-DNA antibody, double-stranded, C3 and C4  Positive ANA with high titer but pattern is atypical most commonly associated with the treated hepatitis C history.  Clinically does not have signs or symptoms for systemic connective tissue disease.  Reviewed other previous findings including past liver biopsy and iron levels he discussed some concern for hemochromatosis but history and clinical picture do not fit.  Lower pretest suspicion but will check specific antibody panel as above including serum complements.  If negative though with no typical clinical features would not recommend additional testing in this or any empiric treatment.  Fatigue, unspecified type  Definitely the fatigue or dyspnea and limited tolerance for exertion are main complaint more so than specific joint or muscle pains.  Could be related to his autonomic dysregulation and hypotension.  He was recently started on testosterone replacement therapy for what sounds to be  reported as a significantly low level though I do not have these for review.  He does not have obstructive coronary artery disease or history of thrombotic events I do not think there is any greater than normal risk for Amy to replace to at least low normal range.  History of hepatitis C  Previously completed treated in the 90s discussed this may be associated with the atypical ANA pattern as described above.  Orthostatic hypotension  Appearing most consistent with some form of autonomic dysregulation managing this with cardiology.  No specific CAD or adrenal insufficiency mechanism identified.  Does have tortuous superficial varicosities in lower extremities but without peripheral edema not suggestive of peripheral vascular problem involved.  Orders: Orders Placed This Encounter  Procedures   Anti-scleroderma antibody   RNP Antibody   Anti-Smith antibody   Sjogrens syndrome-A extractable nuclear antibody   Sjogrens syndrome-B extractable nuclear antibody   Anti-DNA antibody, double-stranded   C3 and C4   No orders of the defined types were placed in this encounter.    Follow-Up Instructions: Return if symptoms worsen or fail to improve.   Collier Salina, MD  Note - This record has been created using [employer].  Chart creation errors have been sought, but may not always  have been located. Such creation errors do not reflect on  the standard of medical care.

## 2022-10-31 ENCOUNTER — Ambulatory Visit: Payer: Medicare Other | Attending: Internal Medicine | Admitting: Internal Medicine

## 2022-10-31 ENCOUNTER — Encounter: Payer: Self-pay | Admitting: Internal Medicine

## 2022-10-31 VITALS — BP 110/70 | HR 67 | Resp 16 | Ht 70.0 in | Wt 182.0 lb

## 2022-10-31 DIAGNOSIS — Z8619 Personal history of other infectious and parasitic diseases: Secondary | ICD-10-CM

## 2022-10-31 DIAGNOSIS — R5383 Other fatigue: Secondary | ICD-10-CM

## 2022-10-31 DIAGNOSIS — R768 Other specified abnormal immunological findings in serum: Secondary | ICD-10-CM | POA: Diagnosis not present

## 2022-10-31 DIAGNOSIS — I951 Orthostatic hypotension: Secondary | ICD-10-CM

## 2022-10-31 DIAGNOSIS — R7689 Other specified abnormal immunological findings in serum: Secondary | ICD-10-CM

## 2022-10-31 NOTE — Progress Notes (Incomplete)
Office Visit Note  Patient: Zachary Lawrence.             Date of Birth: 10/13/1951           MRN: [id number]             PCP: Debbrah Alar, NP Referring: Debbrah Alar, NP Visit Date: 10/31/2022 Occupation: Realtor  Subjective:  New Patient (Initial Visit) Zachary Lawrence)   History of Present Illness: Zachary Lawrence. is a 71 y.o. male here for evaluation of positive ANA with chronic fatigue. He has a medical history of chronic hepatitis C treated in the 90s and orthostatic hypotension without other known coronary disease. Fatigue symptoms started since 2017 he calls first experiencing severe fatigue after dragging a log on his property and since then worsening exertion intolerance and some shortness of breath with activity. He has found this debilitating at times. Does experience muscle soreness some of the time usually after increased exertion. He does not see significant peripheral swelling. No coughing or specific pulmonary symptoms. He has had considerable number of evaluations. Most recently also saw another specialist with workup showing low testosterone levels he thinks level was 20s and started Alma injection replacement for this, so far 3 total doses. Iron levels were apparently also high.  He had previous liver biopsy without significant abnormality just fatty liver disease. Function tests have been normal besides chronic mild bilirubin elevation. He has mild chronic facial rash with dry skin and actinic keratoses for which he sees dermatology.   Labs reviewed 07/2022 ANA 1:1280 rods and rings 1:320 speckled RF neg ESR 1 CK 123 TSH T3 T4 wnl CBC Plts 145  Activities of Daily Living:  Patient reports morning stiffness for 2 minutes.   Patient Denies nocturnal pain.  Difficulty dressing/grooming: Denies Difficulty climbing stairs: Denies Difficulty getting out of chair: Denies Difficulty using hands for taps, buttons, cutlery, and/or writing: Reports  Review  of Systems  Constitutional:  Positive for fatigue.  HENT:  Positive for mouth dryness. Negative for mouth sores.   Eyes:  Negative for dryness.  Respiratory:  Positive for shortness of breath.   Cardiovascular:  Negative for chest pain and palpitations.  Gastrointestinal:  Negative for blood in stool, constipation and diarrhea.  Endocrine: Negative for increased urination.  Genitourinary:  Negative for involuntary urination.  Musculoskeletal:  Positive for morning stiffness. Negative for joint pain, gait problem, joint pain, joint swelling, myalgias, muscle weakness, muscle tenderness and myalgias.  Skin:  Negative for color change, rash, hair loss and sensitivity to sunlight.  Allergic/Immunologic: Negative for susceptible to infections.  Neurological:  Positive for dizziness. Negative for headaches.  Hematological:  Negative for swollen glands.  Psychiatric/Behavioral:  Negative for depressed mood and sleep disturbance. The patient is not nervous/anxious.     PMFS History:  Patient Active Problem List   Diagnosis Date Noted  . Positive ANA (antinuclear antibody) 08/12/2022  . RLS (restless legs syndrome) 08/01/2022  . Fatty liver 07/11/2022  . Fatigue 07/05/2022  . Chronotropic incompetence 06/08/2022  . COVID-19 virus infection 02/20/2022  . Unvaccinated for covid-19 02/20/2022  . Orthostatic hypotension 06/15/2021  . History of hepatitis C   . Hyperlipidemia 10/11/2020  . Rhus dermatitis 03/19/2019  . Vertigo 03/06/2016  . Dyspnea on exertion 03/06/2016  . Benign non-nodular prostatic hyperplasia with lower urinary tract symptoms 02/09/2016  . History of acute hepatitis 09/09/2014  . Esophageal reflux 09/09/2014  . Allergic rhinitis 09/09/2014  . 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 Fatty liver   . GERD (gastroesophageal reflux disease)   . History of hepatitis C    s/p curative therapy in the 90's.    . Nephrolithiasis 07/2022    incidental finding on [place]  . Orthostatic hypotension 06/15/2021  . Overactive bladder     Family History  Problem Relation Age of Onset  . Alzheimer's disease Mother   . CAD Father 66       CABG/Stent  . Heart disease Father        CAD with CABG and stent  . Ovarian cancer Sister   . Heart disease Paternal Uncle   . Cancer Paternal Uncle        prostate cancer   Past Surgical History:  Procedure Laterality Date  . CATARACT EXTRACTION Bilateral   . CHOLECYSTECTOMY  1992  . TONSILLECTOMY     Social History   Social History Narrative   Lives alone.     Immunization History  Administered Date(s) Administered  . PNEUMOCOCCAL CONJUGATE-20 06/14/2021  . Tdap 11/28/2009, 04/18/2018     Objective: Vital Signs: BP 110/70 (BP Location: Right Arm, Patient Position: Sitting, Cuff Size: Normal)   Pulse 67   Resp 16   Ht _0  (1.778 m)   Wt 182 lb (82.6 kg)   BMI 26.11 kg/m    Physical Exam HENT:     Right Ear: External ear normal.     Left Ear: External ear normal.     Mouth/Throat:     Mouth: Mucous membranes are moist.     Pharynx: Oropharynx is clear.  Eyes:     Conjunctiva/sclera: Conjunctivae normal.  Cardiovascular:     Rate and Rhythm: Normal rate and regular rhythm.     Comments: Tortuous superficial veins on lower extremities, no edema or erythema Pulmonary:     Effort: Pulmonary effort is normal.     Breath sounds: Normal breath sounds.  Musculoskeletal:     Right lower leg: No edema.     Left lower leg: No edema.  Lymphadenopathy:     Cervical: No cervical adenopathy.  Skin:    General: Skin is warm and dry.     Findings: No rash.     Comments: Forearm extensor surface bruising present  Neurological:     Mental Status: He is alert.  Psychiatric:        Mood and Affect: Mood normal.      Musculoskeletal Exam:  Shoulders full ROM no tenderness or swelling Elbows full ROM no tenderness or swelling Wrists full ROM no tenderness or  swelling Fingers full ROM no tenderness or swelling Knees full ROM no tenderness or swelling  Investigation: No additional findings.  Imaging: No results found.  Recent Labs: Lab Results  Component Value Date   WBC 5.0 07/04/2022   HGB 13.6 07/04/2022   PLT 145.0 (L) 07/04/2022   NA 142 07/04/2022   K 3.5 07/04/2022   CL 106 07/04/2022   CO2 28 07/04/2022   GLUCOSE 96 07/04/2022   BUN 15 07/04/2022   CREATININE 0.94 07/04/2022   BILITOT 1.4 (H) 08/21/2022   ALKPHOS 59 08/21/2022   AST 18 08/21/2022   ALT 13 08/21/2022   PROT 6.4 08/21/2022   ALBUMIN 4.5 08/21/2022   CALCIUM 9.2 07/04/2022   GFRAA 69 05/31/2020    Speciality Comments: No specialty comments available.  Procedures:  No procedures performed Allergies: Atorvastatin   Assessment / Plan:  Visit Diagnoses: Positive ANA (antinuclear antibody) - Plan: Anti-scleroderma antibody, RNP Antibody, Anti-Smith antibody, Sjogrens syndrome-A extractable nuclear antibody, Sjogrens syndrome-B extractable nuclear antibody, Anti-DNA antibody, double-stranded, C3 and C4  Fatigue, unspecified type  Definitely the fatigue or dyspnea and limited tolerance for exertion are main complaint more so than specific joint or muscle pains.  Could be related  History of hepatitis C  Previously completed treated in the 90s discussed this may be associated with the atypical ANA pattern as described above.  Orthostatic hypotension  Appearing most consistent with some form of autonomic dysregulation managing this with cardiology.  No specific CAD or adrenal insufficiency mechanism identified.  Does have tortuous superficial varicosities in lower extremities but without peripheral edema not suggestive of peripheral vascular problem involved.  Orders: Orders Placed This Encounter  Procedures  . Anti-scleroderma antibody  . RNP Antibody  . Anti-Smith antibody  . Sjogrens syndrome-A extractable nuclear antibody  . Sjogrens  syndrome-B extractable nuclear antibody  . Anti-DNA antibody, double-stranded  . C3 and C4   No orders of the defined types were placed in this encounter.   Face-to-face time spent with patient was *** minutes. Greater than 50% of time was spent in counseling and coordination of care.  Follow-Up Instructions: Return if symptoms worsen or fail to improve.   Collier Salina, MD  Note - This record has been created using [employer].  Chart creation errors have been sought, but may not always  have been located. Such creation errors do not reflect on  the standard of medical care.

## 2022-11-02 LAB — RNP ANTIBODY: Ribonucleic Protein(ENA) Antibody, IgG: <1 AI

## 2022-11-02 LAB — SJOGRENS SYNDROME-A EXTRACTABLE NUCLEAR ANTIBODY: SSA (Ro) (ENA) Antibody, IgG: <1 AI

## 2022-11-02 LAB — C3 AND C4
C3 Complement: 92 mg/dL (ref 82–185)
C4 Complement: 18 mg/dL (ref 15–53)

## 2022-11-02 LAB — ANTI-SMITH ANTIBODY: ENA SM Ab Ser-aCnc: <1 AI

## 2022-11-02 LAB — SJOGRENS SYNDROME-B EXTRACTABLE NUCLEAR ANTIBODY: SSB (La) (ENA) Antibody, IgG: <1 AI

## 2022-11-02 LAB — ANTI-DNA ANTIBODY, DOUBLE-STRANDED: ds DNA Ab: 1 IU/mL

## 2022-11-02 LAB — ANTI-SCLERODERMA ANTIBODY: Scleroderma (Scl-70) (ENA) Antibody, IgG: <1 AI

## 2022-11-06 ENCOUNTER — Ambulatory Visit: Payer: Medicare Other | Admitting: Physician Assistant

## 2022-11-06 DIAGNOSIS — E271 Primary adrenocortical insufficiency: Secondary | ICD-10-CM | POA: Diagnosis not present

## 2022-11-06 DIAGNOSIS — E291 Testicular hypofunction: Secondary | ICD-10-CM | POA: Diagnosis not present

## 2022-11-06 DIAGNOSIS — D510 Vitamin B12 deficiency anemia due to intrinsic factor deficiency: Secondary | ICD-10-CM | POA: Diagnosis not present

## 2022-11-06 DIAGNOSIS — G729 Myopathy, unspecified: Secondary | ICD-10-CM | POA: Diagnosis not present

## 2022-11-20 DIAGNOSIS — D510 Vitamin B12 deficiency anemia due to intrinsic factor deficiency: Secondary | ICD-10-CM | POA: Diagnosis not present

## 2022-11-20 DIAGNOSIS — E291 Testicular hypofunction: Secondary | ICD-10-CM | POA: Diagnosis not present

## 2022-12-05 DIAGNOSIS — D510 Vitamin B12 deficiency anemia due to intrinsic factor deficiency: Secondary | ICD-10-CM | POA: Diagnosis not present

## 2022-12-05 DIAGNOSIS — E291 Testicular hypofunction: Secondary | ICD-10-CM | POA: Diagnosis not present

## 2022-12-17 IMAGING — DX DG CHEST 1V PORT
1 series · 1 of 1 positions shown · non-contrast
Comparison: 04/28/2020

CLINICAL DATA: Abdominal pain.  COVID 4 days ago.

EXAM:
PORTABLE CHEST 1 VIEW

[chest ap]
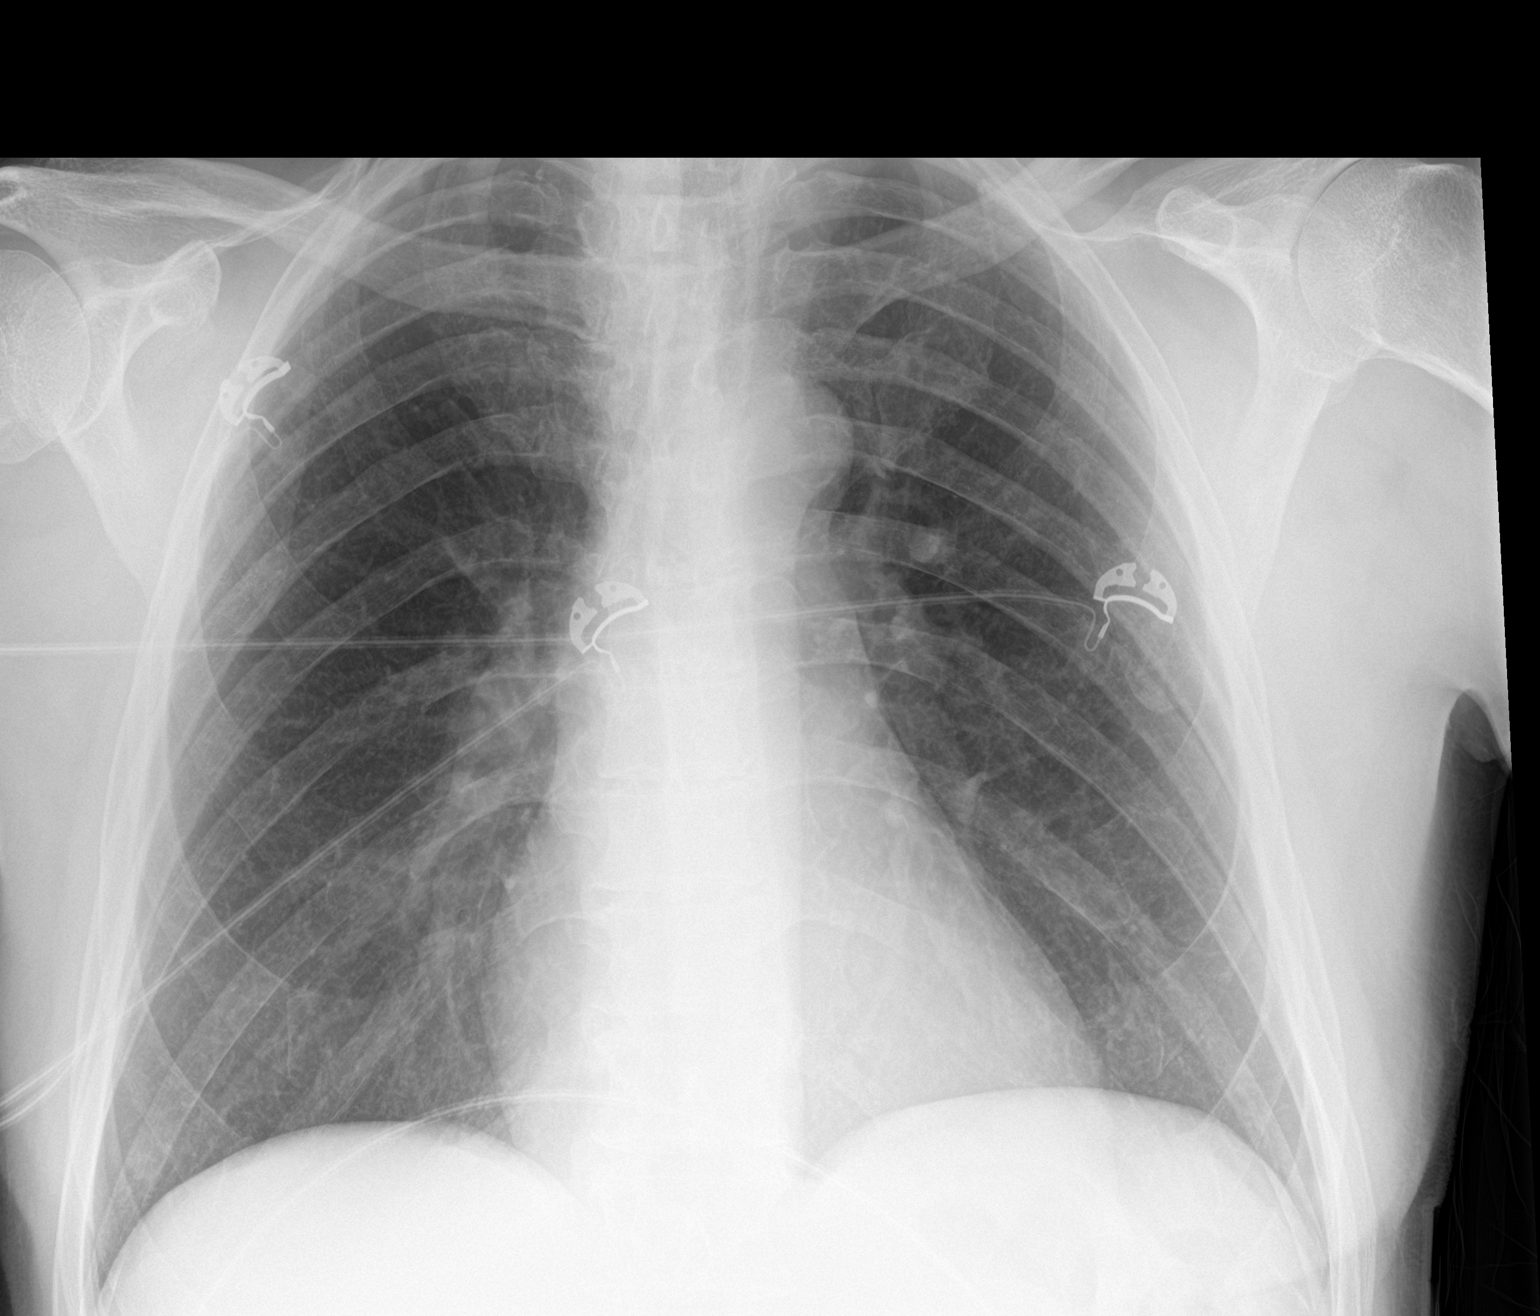

[1 of 1 positions shown; findings below may reference images not displayed]

FINDINGS: The heart size and mediastinal contours are within normal limits.
Both lungs are clear. The visualized skeletal structures are
unremarkable.
IMPRESSION: No active disease.

## 2022-12-18 DIAGNOSIS — E291 Testicular hypofunction: Secondary | ICD-10-CM | POA: Diagnosis not present

## 2022-12-18 DIAGNOSIS — D510 Vitamin B12 deficiency anemia due to intrinsic factor deficiency: Secondary | ICD-10-CM | POA: Diagnosis not present

## 2022-12-23 NOTE — Progress Notes (Unsigned)
Cardiology Office Note Date:  12/25/2022  Patient ID:  Zachary Lawrence., DOB Nov 17, 1951, MRN [id number] PCP:  Debbrah Alar, NP  Cardiologist:  Dr. Gwenlyn Found Electrophysiologist: Dr. Lovena Le    Chief Complaint:  4 mo  History of Present Illness: Zachary Lawrence. is a 72 y.o. male with history of suspect autonomic dysfunction/orthostatic dizziness, Hep C (treated in the 53's)  He saw D. Gwenlyn Found 04/25/22, discussed hx of dizziness, his PMD team suggested florinef which Dr. Gwenlyn Found felt was reasonable, though ultimately at a visit with Dr. Lovena Le started on midodrine with improved symptoms. AT the time of this visit, he felt well, occ CP felt to be atypical, hx of prior CT coronaries with distal LAD disease only  He saw Dr. Lovena Le last 06/08/22, discussed hx of GXT test which showed orthostasis and mild chronotropic incompetence with his HR getting to 116/min. He was hypotensive after exercise and started him on midodrine which initially helped but reported some side effects and still complained that he did not have the energy he would like. Midodrine was stopped and started on florinef.  Stopped Aug 2023 quickly 2/2 side effects  A number of visits with his Primary provider working om management of his RLS with gabapentin, started on lexapro it seems in effort to improve his fatigue without improvement, he continued to have what was described as extreme fatigue, generalized weakness, very frustrated with his QOL Planned for labs  Referred to rheumatology with + ANA as well as started on testosterone with low levels.  He saw Dr. Benjamine Mola 10/31/22 planned for further labs/and evaluation for possible causes of his fatigue and abnormal labs, though seems to have low suspicion of autoimmune process.  TODAY He is doing "well" but not great. Since his symptoms of orthostatic lightheadedness started (7 years or so ago), he has finally had some minimal improvement in symptoms since being started on  the testosterone. When he has lightheaded spells, they are the same and has symptomatic/severe, but they are less freuent. NO CP, no palpitations, no SOB He exercises regularly and is very active with typically good exertional capacity, though on days that his symptoms are flared up, he feels pretty awful and very much has to pace himself.  He has not had syncope.  He has not heard back from rheumatology about any of their findings/recommendations   Past Medical History:  Diagnosis Date   Fatty liver    GERD (gastroesophageal reflux disease)    History of hepatitis C    s/p curative therapy in the 90's.     Nephrolithiasis 07/2022   incidental finding on [place]   Orthostatic hypotension 06/15/2021   Overactive bladder     Past Surgical History:  Procedure Laterality Date   CATARACT EXTRACTION Bilateral    CHOLECYSTECTOMY  1992   TONSILLECTOMY      Current Outpatient Medications  Medication Sig Dispense Refill   aspirin EC 325 MG tablet Take 325 mg by mouth as needed for mild pain. As directed     OVER THE COUNTER MEDICATION as directed. Aerie's- otc for eyes.     testosterone cypionate (DEPOTESTOSTERONE CYPIONATE) 200 MG/ML injection Inject 200 mg into the muscle every 14 (fourteen) days.     No current facility-administered medications for this visit.    Allergies:   Atorvastatin   Social History:  The patient  reports that he quit smoking about 47 years ago. His smoking use included cigarettes. He has a 0.60 pack-year smoking  history. He has never been exposed to tobacco smoke. He has never used smokeless tobacco. He reports current alcohol use. He reports that he does not use drugs.   Family History:  The patient's family history includes Alzheimer's disease in his mother; CAD (age of onset: 53) in his father; Cancer in his paternal uncle; Heart disease in his father and paternal uncle; Ovarian cancer in his sister.  ROS:  Please see the history of present illness.    All  other systems are reviewed and otherwise negative.   PHYSICAL EXAM:  VS:  BP 118/60   Pulse 64   Ht 6' (1.829 m)   Wt 182 lb (82.6 kg)   SpO2 98%   BMI 24.68 kg/m  BMI: Body mass index is 24.68 kg/m. Well nourished, well developed, in no acute distress HEENT: normocephalic, atraumatic Neck: no JVD, carotid bruits or masses Cardiac:  RRR; no significant murmurs, no rubs, or gallops Lungs:  CTA b/l, no wheezing, rhonchi or rales Abd: soft, nontender MS: no deformity or atrophy Ext: no edema Skin: warm and dry, no rash Neuro:  No gross deficits appreciated Psych: euthymic mood, full affect   EKG:  not done today  05/31/20: TTE 1. Normal LV systolic function; grade 1 diastolic dysfunction; mild MR.   2. Left ventricular ejection fraction, by estimation, is 55 to 60%. The  left ventricle has normal function. The left ventricle has no regional  wall motion abnormalities. Left ventricular diastolic parameters are  consistent with Grade I diastolic  dysfunction (impaired relaxation).   3. Right ventricular systolic function is normal. The right ventricular  size is normal. There is normal pulmonary artery systolic pressure.   4. The mitral valve is normal in structure. Mild mitral valve  regurgitation. No evidence of mitral stenosis.   5. The aortic valve is tricuspid. Aortic valve regurgitation is not  visualized. Mild aortic valve sclerosis is present, with no evidence of  aortic valve stenosis.   6. The inferior vena cava is normal in size with greater than 50%  respiratory variability, suggesting right atrial pressure of 3 mmHg.    06/06/20: Coronary CT IMPRESSION: 1. Coronary calcium score of 60. This was 38th percentile for age and sex matched control. 2.  Normal coronary origin with left dominance. 3.  Mild atherosclerosis of the LAD.  CAD-RADS = 2. 4.  Consider non-atherosclerotic causes of chest pain. 5.  Recommend preventive therapy and risk factor modification. 6.   This study has been submitted for FFR analysis. IMPRESSION: 1. CT FFR flow analysis demonstrates possible hemodynamically flow limiting lesion in the distal LAD. The mid FFR is 0.87 and distal FFR in the LAD is 0.77. The vessel diameter in the distal LAD is < 24m. There is gradual decrease in FFR in the distal LAD and is consistent with gradual decrease in luminal caliber and not focal stenosis. 2.  Medical management recommended.  Recent Labs: 07/04/2022: BUN 15; Creatinine, Ser 0.94; Hemoglobin 13.6; Platelets 145.0; Potassium 3.5; Sodium 142; TSH 1.72 08/21/2022: ALT 13  08/21/2022: Chol/HDL Ratio 2.6; Cholesterol, Total 177; HDL 67; LDL Chol Calc (NIH) 100; Triglycerides 53   CrCl cannot be calculated (Patient's most recent lab result is older than the maximum 21 days allowed.).   Wt Readings from Last 3 Encounters:  12/25/22 182 lb (82.6 kg)  10/31/22 182 lb (82.6 kg)  10/29/22 182 lb 12.8 oz (82.9 kg)     Other studies reviewed: Additional studies/records reviewed today include: summarized above  ASSESSMENT AND PLAN:  Autonomic dysfunction Orthostatic hypotension Intolerant of midodrine and florinef Minimal improvement in his symptoms of later (since starting on testosterone. Discussed compression wear, thigh sleeves/abdominal binder Adequate hydration and slat intake Symptom recognition and safety all discussed    Disposition: F/u with [place] again in 23mo sooner if needed  Current medicines are reviewed at length with the patient today.  The patient did not have any concerns regarding medicines.  SVenetia Night PA-C 12/25/2022 1:00 PM     [place] [phone](office)  [phone])

## 2022-12-25 ENCOUNTER — Ambulatory Visit: Payer: Medicare Other | Attending: Physician Assistant | Admitting: Physician Assistant

## 2022-12-25 ENCOUNTER — Encounter: Payer: Self-pay | Admitting: Physician Assistant

## 2022-12-25 VITALS — BP 118/60 | HR 64 | Ht 72.0 in | Wt 182.0 lb

## 2022-12-25 DIAGNOSIS — I951 Orthostatic hypotension: Secondary | ICD-10-CM | POA: Diagnosis not present

## 2022-12-25 NOTE — Patient Instructions (Signed)
Medication Instructions:   Your physician recommends that you continue on your current medications as directed. Please refer to the Current Medication list given to you today.    *If you need a refill on your cardiac medications before your next appointment, please call your pharmacy*   Lab Work: [place]    If you have labs (blood work) drawn today and your tests are completely normal, you will receive your results only by: [place] (if you have MyChart) OR A paper copy in the mail If you have any lab test that is abnormal or we need to change your treatment, we will call you to review the results.   Testing/Procedures: NONE ORDERED  TODAY     Follow-Up: At [facility], you and your health needs are our priority.  As part of our continuing mission to provide you with exceptional heart care, we have created designated Provider Care Teams.  These Care Teams include your primary Cardiologist (physician) and Advanced Practice Providers (APPs -  Physician Assistants and Nurse Practitioners) who all work together to provide you with the care you need, when you need it.  We recommend signing up for the patient portal called "MyChart".  Sign up information is provided on this After Visit Summary.  MyChart is used to connect with patients for Virtual Visits (Telemedicine).  Patients are able to view lab/test results, encounter notes, upcoming appointments, etc.  Non-urgent messages can be sent to your provider as well.   To learn more about what you can do with MyChart, go to [email or web address].    Your next appointment:   6 month(s)  Provider:   You may see Dr Lovena Le  or one of the following Advanced Practice Providers on your designated Care Team:   Tommye Standard, PA-C Legrand Como "[employer]" [place], [place] Mamie Levers, NP     PLEASE WEAR THIGH SLEEVES AND COMPRESSION   ABDOMINAL BINDER    RHEUMATOLOGIST IS DR RICE

## 2022-12-31 DIAGNOSIS — E039 Hypothyroidism, unspecified: Secondary | ICD-10-CM | POA: Diagnosis not present

## 2022-12-31 DIAGNOSIS — R790 Abnormal level of blood mineral: Secondary | ICD-10-CM | POA: Diagnosis not present

## 2022-12-31 DIAGNOSIS — E291 Testicular hypofunction: Secondary | ICD-10-CM | POA: Diagnosis not present

## 2022-12-31 DIAGNOSIS — R760 Raised antibody titer: Secondary | ICD-10-CM | POA: Diagnosis not present

## 2022-12-31 DIAGNOSIS — D510 Vitamin B12 deficiency anemia due to intrinsic factor deficiency: Secondary | ICD-10-CM | POA: Diagnosis not present

## 2023-01-02 DIAGNOSIS — L570 Actinic keratosis: Secondary | ICD-10-CM | POA: Diagnosis not present

## 2023-01-02 DIAGNOSIS — D485 Neoplasm of uncertain behavior of skin: Secondary | ICD-10-CM | POA: Diagnosis not present

## 2023-01-02 DIAGNOSIS — Z86008 Personal history of in-situ neoplasm of other site: Secondary | ICD-10-CM | POA: Diagnosis not present

## 2023-01-02 DIAGNOSIS — I781 Nevus, non-neoplastic: Secondary | ICD-10-CM | POA: Diagnosis not present

## 2023-01-02 DIAGNOSIS — D0421 Carcinoma in situ of skin of right ear and external auricular canal: Secondary | ICD-10-CM | POA: Diagnosis not present

## 2023-01-02 DIAGNOSIS — Z129 Encounter for screening for malignant neoplasm, site unspecified: Secondary | ICD-10-CM | POA: Diagnosis not present

## 2023-01-04 DIAGNOSIS — I8311 Varicose veins of right lower extremity with inflammation: Secondary | ICD-10-CM | POA: Diagnosis not present

## 2023-01-04 DIAGNOSIS — R252 Cramp and spasm: Secondary | ICD-10-CM | POA: Diagnosis not present

## 2023-01-04 DIAGNOSIS — I83891 Varicose veins of right lower extremities with other complications: Secondary | ICD-10-CM | POA: Diagnosis not present

## 2023-01-04 DIAGNOSIS — I83813 Varicose veins of bilateral lower extremities with pain: Secondary | ICD-10-CM | POA: Diagnosis not present

## 2023-01-04 DIAGNOSIS — M79661 Pain in right lower leg: Secondary | ICD-10-CM | POA: Diagnosis not present

## 2023-01-18 DIAGNOSIS — D510 Vitamin B12 deficiency anemia due to intrinsic factor deficiency: Secondary | ICD-10-CM | POA: Diagnosis not present

## 2023-01-18 DIAGNOSIS — E291 Testicular hypofunction: Secondary | ICD-10-CM | POA: Diagnosis not present

## 2023-01-18 DIAGNOSIS — M359 Systemic involvement of connective tissue, unspecified: Secondary | ICD-10-CM | POA: Diagnosis not present

## 2023-01-21 DIAGNOSIS — D510 Vitamin B12 deficiency anemia due to intrinsic factor deficiency: Secondary | ICD-10-CM | POA: Diagnosis not present

## 2023-01-21 DIAGNOSIS — E291 Testicular hypofunction: Secondary | ICD-10-CM | POA: Diagnosis not present

## 2023-01-22 DIAGNOSIS — N138 Other obstructive and reflux uropathy: Secondary | ICD-10-CM | POA: Diagnosis not present

## 2023-01-22 DIAGNOSIS — N401 Enlarged prostate with lower urinary tract symptoms: Secondary | ICD-10-CM | POA: Diagnosis not present

## 2023-01-22 DIAGNOSIS — R3589 Other polyuria: Secondary | ICD-10-CM | POA: Diagnosis not present

## 2023-01-31 DIAGNOSIS — E291 Testicular hypofunction: Secondary | ICD-10-CM | POA: Diagnosis not present

## 2023-01-31 DIAGNOSIS — D510 Vitamin B12 deficiency anemia due to intrinsic factor deficiency: Secondary | ICD-10-CM | POA: Diagnosis not present

## 2023-02-01 DIAGNOSIS — I83891 Varicose veins of right lower extremities with other complications: Secondary | ICD-10-CM | POA: Diagnosis not present

## 2023-02-05 DIAGNOSIS — I83891 Varicose veins of right lower extremities with other complications: Secondary | ICD-10-CM | POA: Diagnosis not present

## 2023-02-05 DIAGNOSIS — Z09 Encounter for follow-up examination after completed treatment for conditions other than malignant neoplasm: Secondary | ICD-10-CM | POA: Diagnosis not present

## 2023-02-14 DIAGNOSIS — E291 Testicular hypofunction: Secondary | ICD-10-CM | POA: Diagnosis not present

## 2023-02-14 DIAGNOSIS — D510 Vitamin B12 deficiency anemia due to intrinsic factor deficiency: Secondary | ICD-10-CM | POA: Diagnosis not present

## 2023-02-20 DIAGNOSIS — I83891 Varicose veins of right lower extremities with other complications: Secondary | ICD-10-CM | POA: Diagnosis not present

## 2023-02-28 DIAGNOSIS — E291 Testicular hypofunction: Secondary | ICD-10-CM | POA: Diagnosis not present

## 2023-02-28 DIAGNOSIS — D510 Vitamin B12 deficiency anemia due to intrinsic factor deficiency: Secondary | ICD-10-CM | POA: Diagnosis not present

## 2023-03-13 DIAGNOSIS — I83811 Varicose veins of right lower extremities with pain: Secondary | ICD-10-CM | POA: Diagnosis not present

## 2023-03-13 DIAGNOSIS — I83891 Varicose veins of right lower extremities with other complications: Secondary | ICD-10-CM | POA: Diagnosis not present

## 2023-03-18 DIAGNOSIS — E291 Testicular hypofunction: Secondary | ICD-10-CM | POA: Diagnosis not present

## 2023-03-18 DIAGNOSIS — D510 Vitamin B12 deficiency anemia due to intrinsic factor deficiency: Secondary | ICD-10-CM | POA: Diagnosis not present

## 2023-03-19 DIAGNOSIS — D510 Vitamin B12 deficiency anemia due to intrinsic factor deficiency: Secondary | ICD-10-CM | POA: Diagnosis not present

## 2023-03-19 DIAGNOSIS — E291 Testicular hypofunction: Secondary | ICD-10-CM | POA: Diagnosis not present

## 2023-03-27 DIAGNOSIS — I83891 Varicose veins of right lower extremities with other complications: Secondary | ICD-10-CM | POA: Diagnosis not present

## 2023-03-27 DIAGNOSIS — I87391 Chronic venous hypertension (idiopathic) with other complications of right lower extremity: Secondary | ICD-10-CM | POA: Diagnosis not present

## 2023-04-01 DIAGNOSIS — E291 Testicular hypofunction: Secondary | ICD-10-CM | POA: Diagnosis not present

## 2023-04-01 DIAGNOSIS — D510 Vitamin B12 deficiency anemia due to intrinsic factor deficiency: Secondary | ICD-10-CM | POA: Diagnosis not present

## 2023-04-08 DIAGNOSIS — L821 Other seborrheic keratosis: Secondary | ICD-10-CM | POA: Diagnosis not present

## 2023-04-08 DIAGNOSIS — L820 Inflamed seborrheic keratosis: Secondary | ICD-10-CM | POA: Diagnosis not present

## 2023-04-08 DIAGNOSIS — D0421 Carcinoma in situ of skin of right ear and external auricular canal: Secondary | ICD-10-CM | POA: Diagnosis not present

## 2023-04-08 DIAGNOSIS — L570 Actinic keratosis: Secondary | ICD-10-CM | POA: Diagnosis not present

## 2023-04-08 DIAGNOSIS — D485 Neoplasm of uncertain behavior of skin: Secondary | ICD-10-CM | POA: Diagnosis not present

## 2023-04-15 DIAGNOSIS — D508 Other iron deficiency anemias: Secondary | ICD-10-CM | POA: Diagnosis not present

## 2023-04-15 DIAGNOSIS — R7982 Elevated C-reactive protein (CRP): Secondary | ICD-10-CM | POA: Diagnosis not present

## 2023-04-15 DIAGNOSIS — R700 Elevated erythrocyte sedimentation rate: Secondary | ICD-10-CM | POA: Diagnosis not present

## 2023-04-15 DIAGNOSIS — D510 Vitamin B12 deficiency anemia due to intrinsic factor deficiency: Secondary | ICD-10-CM | POA: Diagnosis not present

## 2023-04-15 DIAGNOSIS — R790 Abnormal level of blood mineral: Secondary | ICD-10-CM | POA: Diagnosis not present

## 2023-04-15 DIAGNOSIS — E291 Testicular hypofunction: Secondary | ICD-10-CM | POA: Diagnosis not present

## 2023-04-23 ENCOUNTER — Ambulatory Visit: Payer: Medicare Other | Attending: Cardiovascular Disease | Admitting: Cardiovascular Disease

## 2023-04-29 DIAGNOSIS — D510 Vitamin B12 deficiency anemia due to intrinsic factor deficiency: Secondary | ICD-10-CM | POA: Diagnosis not present

## 2023-04-29 DIAGNOSIS — E291 Testicular hypofunction: Secondary | ICD-10-CM | POA: Diagnosis not present

## 2023-04-29 DIAGNOSIS — F411 Generalized anxiety disorder: Secondary | ICD-10-CM | POA: Diagnosis not present

## 2023-04-30 DIAGNOSIS — E291 Testicular hypofunction: Secondary | ICD-10-CM | POA: Diagnosis not present

## 2023-04-30 DIAGNOSIS — D510 Vitamin B12 deficiency anemia due to intrinsic factor deficiency: Secondary | ICD-10-CM | POA: Diagnosis not present

## 2023-05-15 DIAGNOSIS — E291 Testicular hypofunction: Secondary | ICD-10-CM | POA: Diagnosis not present

## 2023-05-15 DIAGNOSIS — D510 Vitamin B12 deficiency anemia due to intrinsic factor deficiency: Secondary | ICD-10-CM | POA: Diagnosis not present

## 2023-05-16 DIAGNOSIS — E291 Testicular hypofunction: Secondary | ICD-10-CM | POA: Diagnosis not present

## 2023-05-16 DIAGNOSIS — D510 Vitamin B12 deficiency anemia due to intrinsic factor deficiency: Secondary | ICD-10-CM | POA: Diagnosis not present

## 2023-05-30 DIAGNOSIS — E291 Testicular hypofunction: Secondary | ICD-10-CM | POA: Diagnosis not present

## 2023-05-30 DIAGNOSIS — D510 Vitamin B12 deficiency anemia due to intrinsic factor deficiency: Secondary | ICD-10-CM | POA: Diagnosis not present

## 2023-05-31 DIAGNOSIS — E291 Testicular hypofunction: Secondary | ICD-10-CM | POA: Diagnosis not present

## 2023-05-31 DIAGNOSIS — D510 Vitamin B12 deficiency anemia due to intrinsic factor deficiency: Secondary | ICD-10-CM | POA: Diagnosis not present

## 2023-06-12 DIAGNOSIS — E291 Testicular hypofunction: Secondary | ICD-10-CM | POA: Diagnosis not present

## 2023-06-12 DIAGNOSIS — D510 Vitamin B12 deficiency anemia due to intrinsic factor deficiency: Secondary | ICD-10-CM | POA: Diagnosis not present

## 2023-06-27 DIAGNOSIS — E291 Testicular hypofunction: Secondary | ICD-10-CM | POA: Diagnosis not present

## 2023-06-27 DIAGNOSIS — D510 Vitamin B12 deficiency anemia due to intrinsic factor deficiency: Secondary | ICD-10-CM | POA: Diagnosis not present

## 2023-07-11 DIAGNOSIS — D510 Vitamin B12 deficiency anemia due to intrinsic factor deficiency: Secondary | ICD-10-CM | POA: Diagnosis not present

## 2023-07-11 DIAGNOSIS — E291 Testicular hypofunction: Secondary | ICD-10-CM | POA: Diagnosis not present

## 2023-07-18 NOTE — Progress Notes (Signed)
  Electrophysiology Office Note:   Date:  07/19/2023  ID:  Zachary Montgomery., DOB 1951/03/22, MRN [id number]  Primary Cardiologist: None Electrophysiologist: Lewayne Bunting, MD      History of Present Illness:   Zachary Montgomery. is a 72 y.o. male with h/o Autonomic dysfunction/orthostasis, Hep C treated in the 90s, and CAD, seen today for routine electrophysiology followup.   Since last being seen in our clinic the patient reports doing about the same. Has occasional ""tremors" in his hands and weakness in his legs. Drinks 3-4 bottles of water and a gatorade some days. Doesn't think he is as hydrated as he should/could be. + Orthostatics today, + dizziness with rapid position changes. Has not tried compression garments and is resistant.   Review of systems complete and found to be negative unless listed in HPI.   EP Information / Studies Reviewed:    EKG is ordered today. Personal review as below.  Personal review of EKG shows NSR at 65 bpm with stable intervals.    Physical Exam:   VS:  BP (!) 83/56 (Patient Position: Standing)   Pulse 80   Ht 6' (1.829 m)   Wt 173 lb 9.6 oz (78.7 kg)   BMI 23.54 kg/m     Wt Readings from Last 3 Encounters:  07/19/23 173 lb 9.6 oz (78.7 kg)  12/25/22 182 lb (82.6 kg)  10/31/22 182 lb (82.6 kg)     Orthostatic VS for the past 24 hrs (Last 3 readings):  BP- Lying Pulse- Lying BP- Sitting Pulse- Sitting BP- Standing at 0 minutes Pulse- Standing at 0 minutes BP- Standing at 3 minutes Pulse- Standing at 3 minutes  07/19/23 0952 116/71 65 102/64 67 93/56 75 (!) 83/56 80     GEN: Well nourished, well developed in no acute distress NECK: No JVD; No carotid bruits CARDIAC: Regular rate and rhythm, no murmurs, rubs, gallops RESPIRATORY:  Clear to auscultation without rales, wheezing or rhonchi  ABDOMEN: Soft, non-tender, non-distended EXTREMITIES:  No edema; No deformity   ASSESSMENT AND PLAN:    Autonomic dysfunction Orthostatic  hypotension Has been intolerant of midodrine and florinef We discussed the role of salt and water repletion, the importance of exercise, often needing to be started in the recumbent position, and the awareness of triggers and the role of ambient heat and dehydration.  We discussed the role of salt and water repletion, the importance of exercise, often needing to be started in the recumbent position, and the awareness of triggers and the role of ambient heat and dehydration.  Discussed salt substitutes with a  goal of 2-3 gms of Sodium or 5-7 gm of sodium Chloride daily.  Rehydration solutions include Liquid IV, NUUN, TriOral, Normralyte pedialyte advanced care and Banana Bags.  Salt tablets include plain salt tablets, SaltStick Vitassium, thermatabs amongst others.   Salt supplements are best used with adjunctive sugar     Follow up with Dr. Ladona Ridgel in 12 months  Signed, Graciella Freer, PA-C

## 2023-07-19 ENCOUNTER — Encounter: Payer: Self-pay | Admitting: Student

## 2023-07-19 ENCOUNTER — Ambulatory Visit: Payer: Medicare Other | Attending: Student | Admitting: Student

## 2023-07-19 VITALS — BP 83/56 | HR 80 | Ht 72.0 in | Wt 173.6 lb

## 2023-07-19 DIAGNOSIS — I951 Orthostatic hypotension: Secondary | ICD-10-CM | POA: Diagnosis not present

## 2023-07-19 NOTE — Patient Instructions (Addendum)
Medication Instructions:  Your physician recommends that you continue on your current medications as directed. Please refer to the Current Medication list given to you today.  *If you need a refill on your cardiac medications before your next appointment, please call your pharmacy*  Lab Work: None ordered If you have labs (blood work) drawn today and your tests are completely normal, you will receive your results only by: MyChart Message (if you have MyChart) OR A paper copy in the mail If you have any lab test that is abnormal or we need to change your treatment, we will call you to review the results.  Follow-Up: At [facility], you and your health needs are our priority.  As part of our continuing mission to provide you with exceptional heart care, we have created designated Provider Care Teams.  These Care Teams include your primary Cardiologist (physician) and Advanced Practice Providers (APPs -  Physician Assistants and Nurse Practitioners) who all work together to provide you with the care you need, when you need it.  Your next appointment:   1 year(s)  Provider:   Lewayne Bunting, MD    Other Instructions We discussed the role of salt and water repletion, the importance of exercise, often needing to be started in the recumbent position, and the awareness of triggers and the role of ambient heat and dehydration.  Discussed salt substitutes with a  goal of 2-3 gms of Sodium or 5-7 gm of sodium Chloride daily.  Rehydration solutions include Liquid IV, NUUN, TriOral, Normralyte pedialyte advanced care and Banana Bags.  Salt tablets include plain salt tablets, SaltStick Vitassium, thermatabs amongst others.   Salt supplements are best used with adjunctive sugar    Thigh circumference: 18 in / 46 cm

## 2023-07-23 DIAGNOSIS — N138 Other obstructive and reflux uropathy: Secondary | ICD-10-CM | POA: Diagnosis not present

## 2023-07-23 DIAGNOSIS — N401 Enlarged prostate with lower urinary tract symptoms: Secondary | ICD-10-CM | POA: Diagnosis not present

## 2023-07-25 DIAGNOSIS — F410 Panic disorder [episodic paroxysmal anxiety] without agoraphobia: Secondary | ICD-10-CM | POA: Diagnosis not present

## 2023-07-25 DIAGNOSIS — E039 Hypothyroidism, unspecified: Secondary | ICD-10-CM | POA: Diagnosis not present

## 2023-07-25 DIAGNOSIS — M542 Cervicalgia: Secondary | ICD-10-CM | POA: Diagnosis not present

## 2023-07-25 DIAGNOSIS — E291 Testicular hypofunction: Secondary | ICD-10-CM | POA: Diagnosis not present

## 2023-07-25 DIAGNOSIS — D510 Vitamin B12 deficiency anemia due to intrinsic factor deficiency: Secondary | ICD-10-CM | POA: Diagnosis not present

## 2023-07-25 DIAGNOSIS — D508 Other iron deficiency anemias: Secondary | ICD-10-CM | POA: Diagnosis not present

## 2023-07-25 DIAGNOSIS — M6281 Muscle weakness (generalized): Secondary | ICD-10-CM | POA: Diagnosis not present

## 2023-07-26 DIAGNOSIS — D510 Vitamin B12 deficiency anemia due to intrinsic factor deficiency: Secondary | ICD-10-CM | POA: Diagnosis not present

## 2023-07-26 DIAGNOSIS — E291 Testicular hypofunction: Secondary | ICD-10-CM | POA: Diagnosis not present

## 2023-07-26 DIAGNOSIS — F410 Panic disorder [episodic paroxysmal anxiety] without agoraphobia: Secondary | ICD-10-CM | POA: Diagnosis not present

## 2023-07-26 DIAGNOSIS — M542 Cervicalgia: Secondary | ICD-10-CM | POA: Diagnosis not present

## 2023-07-29 DIAGNOSIS — G2581 Restless legs syndrome: Secondary | ICD-10-CM | POA: Diagnosis not present

## 2023-07-29 DIAGNOSIS — I83811 Varicose veins of right lower extremities with pain: Secondary | ICD-10-CM | POA: Diagnosis not present

## 2023-07-29 DIAGNOSIS — I8311 Varicose veins of right lower extremity with inflammation: Secondary | ICD-10-CM | POA: Diagnosis not present

## 2023-07-29 DIAGNOSIS — R252 Cramp and spasm: Secondary | ICD-10-CM | POA: Diagnosis not present

## 2023-08-08 DIAGNOSIS — R760 Raised antibody titer: Secondary | ICD-10-CM | POA: Diagnosis not present

## 2023-08-08 DIAGNOSIS — E291 Testicular hypofunction: Secondary | ICD-10-CM | POA: Diagnosis not present

## 2023-08-08 DIAGNOSIS — D510 Vitamin B12 deficiency anemia due to intrinsic factor deficiency: Secondary | ICD-10-CM | POA: Diagnosis not present

## 2023-08-09 DIAGNOSIS — D510 Vitamin B12 deficiency anemia due to intrinsic factor deficiency: Secondary | ICD-10-CM | POA: Diagnosis not present

## 2023-08-09 DIAGNOSIS — R760 Raised antibody titer: Secondary | ICD-10-CM | POA: Diagnosis not present

## 2023-08-09 DIAGNOSIS — E291 Testicular hypofunction: Secondary | ICD-10-CM | POA: Diagnosis not present

## 2023-08-22 DIAGNOSIS — D510 Vitamin B12 deficiency anemia due to intrinsic factor deficiency: Secondary | ICD-10-CM | POA: Diagnosis not present

## 2023-08-22 DIAGNOSIS — E291 Testicular hypofunction: Secondary | ICD-10-CM | POA: Diagnosis not present

## 2023-09-05 DIAGNOSIS — D510 Vitamin B12 deficiency anemia due to intrinsic factor deficiency: Secondary | ICD-10-CM | POA: Diagnosis not present

## 2023-09-05 DIAGNOSIS — E291 Testicular hypofunction: Secondary | ICD-10-CM | POA: Diagnosis not present

## 2023-09-16 DIAGNOSIS — Z79899 Other long term (current) drug therapy: Secondary | ICD-10-CM | POA: Diagnosis not present

## 2023-09-16 DIAGNOSIS — Z961 Presence of intraocular lens: Secondary | ICD-10-CM | POA: Diagnosis not present

## 2023-09-16 DIAGNOSIS — H353132 Nonexudative age-related macular degeneration, bilateral, intermediate dry stage: Secondary | ICD-10-CM | POA: Diagnosis not present

## 2023-09-16 DIAGNOSIS — H524 Presbyopia: Secondary | ICD-10-CM | POA: Diagnosis not present

## 2023-09-19 DIAGNOSIS — E039 Hypothyroidism, unspecified: Secondary | ICD-10-CM | POA: Diagnosis not present

## 2023-09-19 DIAGNOSIS — M329 Systemic lupus erythematosus, unspecified: Secondary | ICD-10-CM | POA: Diagnosis not present

## 2023-09-19 DIAGNOSIS — D510 Vitamin B12 deficiency anemia due to intrinsic factor deficiency: Secondary | ICD-10-CM | POA: Diagnosis not present

## 2023-09-19 DIAGNOSIS — E291 Testicular hypofunction: Secondary | ICD-10-CM | POA: Diagnosis not present

## 2023-09-19 DIAGNOSIS — R5383 Other fatigue: Secondary | ICD-10-CM | POA: Diagnosis not present

## 2023-09-19 DIAGNOSIS — G90A Postural orthostatic tachycardia syndrome (POTS): Secondary | ICD-10-CM | POA: Diagnosis not present

## 2023-09-20 DIAGNOSIS — D510 Vitamin B12 deficiency anemia due to intrinsic factor deficiency: Secondary | ICD-10-CM | POA: Diagnosis not present

## 2023-09-20 DIAGNOSIS — E291 Testicular hypofunction: Secondary | ICD-10-CM | POA: Diagnosis not present

## 2023-09-26 DIAGNOSIS — Z09 Encounter for follow-up examination after completed treatment for conditions other than malignant neoplasm: Secondary | ICD-10-CM | POA: Diagnosis not present

## 2023-09-26 DIAGNOSIS — D125 Benign neoplasm of sigmoid colon: Secondary | ICD-10-CM | POA: Diagnosis not present

## 2023-09-26 DIAGNOSIS — K635 Polyp of colon: Secondary | ICD-10-CM | POA: Diagnosis not present

## 2023-09-26 DIAGNOSIS — Z860100 Personal history of colon polyps, unspecified: Secondary | ICD-10-CM | POA: Diagnosis not present

## 2023-09-26 LAB — HM COLONOSCOPY

## 2023-09-27 DIAGNOSIS — H353132 Nonexudative age-related macular degeneration, bilateral, intermediate dry stage: Secondary | ICD-10-CM | POA: Diagnosis not present

## 2023-09-27 DIAGNOSIS — Z961 Presence of intraocular lens: Secondary | ICD-10-CM | POA: Diagnosis not present

## 2023-09-27 DIAGNOSIS — H43813 Vitreous degeneration, bilateral: Secondary | ICD-10-CM | POA: Diagnosis not present

## 2023-09-27 DIAGNOSIS — H35722 Serous detachment of retinal pigment epithelium, left eye: Secondary | ICD-10-CM | POA: Diagnosis not present

## 2023-10-03 ENCOUNTER — Encounter: Payer: Self-pay | Admitting: Family

## 2023-10-07 DIAGNOSIS — F411 Generalized anxiety disorder: Secondary | ICD-10-CM | POA: Diagnosis not present

## 2023-10-07 DIAGNOSIS — R5383 Other fatigue: Secondary | ICD-10-CM | POA: Diagnosis not present

## 2023-10-07 DIAGNOSIS — E291 Testicular hypofunction: Secondary | ICD-10-CM | POA: Diagnosis not present

## 2023-10-07 DIAGNOSIS — D510 Vitamin B12 deficiency anemia due to intrinsic factor deficiency: Secondary | ICD-10-CM | POA: Diagnosis not present

## 2023-10-08 DIAGNOSIS — R5383 Other fatigue: Secondary | ICD-10-CM | POA: Diagnosis not present

## 2023-10-08 DIAGNOSIS — D510 Vitamin B12 deficiency anemia due to intrinsic factor deficiency: Secondary | ICD-10-CM | POA: Diagnosis not present

## 2023-10-08 DIAGNOSIS — E291 Testicular hypofunction: Secondary | ICD-10-CM | POA: Diagnosis not present

## 2023-10-08 DIAGNOSIS — F411 Generalized anxiety disorder: Secondary | ICD-10-CM | POA: Diagnosis not present

## 2023-10-21 DIAGNOSIS — D510 Vitamin B12 deficiency anemia due to intrinsic factor deficiency: Secondary | ICD-10-CM | POA: Diagnosis not present

## 2023-10-21 DIAGNOSIS — E291 Testicular hypofunction: Secondary | ICD-10-CM | POA: Diagnosis not present

## 2023-11-04 DIAGNOSIS — E291 Testicular hypofunction: Secondary | ICD-10-CM | POA: Diagnosis not present

## 2023-11-04 DIAGNOSIS — D510 Vitamin B12 deficiency anemia due to intrinsic factor deficiency: Secondary | ICD-10-CM | POA: Diagnosis not present

## 2023-11-11 ENCOUNTER — Ambulatory Visit (INDEPENDENT_AMBULATORY_CARE_PROVIDER_SITE_OTHER): Payer: Medicare Other | Admitting: *Deleted

## 2023-11-11 VITALS — BP 100/64 | HR 71 | Ht 72.0 in | Wt 176.0 lb

## 2023-11-11 DIAGNOSIS — Z0000 Encounter for general adult medical examination without abnormal findings: Secondary | ICD-10-CM | POA: Diagnosis not present

## 2023-11-11 NOTE — Patient Instructions (Signed)
[name] , Thank you for taking time to come for your Medicare Wellness Visit. I appreciate your ongoing commitment to your health goals. Please review the following plan we discussed and let me know if I can assist you in the future.    This is a list of the screening recommended for you and due dates:  Health Maintenance  Topic Date Due   Zoster (Shingles) Vaccine (1 of 2) Never done   Medicare Annual Wellness Visit  11/10/2024   DTaP/Tdap/Td vaccine (3 - Td or Tdap) 04/18/2028   Colon Cancer Screening  09/25/2030   Pneumonia Vaccine  Completed   Hepatitis C Screening  Completed   HPV Vaccine  Aged Out   Flu Shot  Discontinued   COVID-19 Vaccine  Discontinued    Next appointment: Follow up in one year for your annual wellness visit.   Preventive Care 20 Years and Older, Male Preventive care refers to lifestyle choices and visits with your health care provider that can promote health and wellness. What does preventive care include? A yearly physical exam. This is also called an annual well check. Dental exams once or twice a year. Routine eye exams. Ask your health care provider how often you should have your eyes checked. Personal lifestyle choices, including: Daily care of your teeth and gums. Regular physical activity. Eating a healthy diet. Avoiding tobacco and drug use. Limiting alcohol use. Practicing safe sex. Taking low doses of aspirin every day. Taking vitamin and mineral supplements as recommended by your health care provider. What happens during an annual well check? The services and screenings done by your health care provider during your annual well check will depend on your age, overall health, lifestyle risk factors, and family history of disease. Counseling  Your health care provider may ask you questions about your: Alcohol use. Tobacco use. Drug use. Emotional well-being. Home and relationship well-being. Sexual activity. Eating habits. History of  falls. Memory and ability to understand (cognition). Work and work Astronomer. Screening  You may have the following tests or measurements: Height, weight, and BMI. Blood pressure. Lipid and cholesterol levels. These may be checked every 5 years, or more frequently if you are over 33 years old. Skin check. Lung cancer screening. You may have this screening every year starting at age 26 if you have a 30-pack-year history of smoking and currently smoke or have quit within the past 15 years. Fecal occult blood test (FOBT) of the stool. You may have this test every year starting at age 52. Flexible sigmoidoscopy or colonoscopy. You may have a sigmoidoscopy every 5 years or a colonoscopy every 10 years starting at age 60. Prostate cancer screening. Recommendations will vary depending on your family history and other risks. Hepatitis C blood test. Hepatitis B blood test. Sexually transmitted disease (STD) testing. Diabetes screening. This is done by checking your blood sugar (glucose) after you have not eaten for a while (fasting). You may have this done every 1-3 years. Abdominal aortic aneurysm (AAA) screening. You may need this if you are a current or former smoker. Osteoporosis. You may be screened starting at age 46 if you are at high risk. Talk with your health care provider about your test results, treatment options, and if necessary, the need for more tests. Vaccines  Your health care provider may recommend certain vaccines, such as: Influenza vaccine. This is recommended every year. Tetanus, diphtheria, and acellular pertussis (Tdap, Td) vaccine. You may need a Td booster every 10 years. Zoster  vaccine. You may need this after age 63. Pneumococcal 13-valent conjugate (PCV13) vaccine. One dose is recommended after age 61. Pneumococcal polysaccharide (PPSV23) vaccine. One dose is recommended after age 100. Talk to your health care provider about which screenings and vaccines you need and  how often you need them. This information is not intended to replace advice given to you by your health care provider. Make sure you discuss any questions you have with your health care provider. Document Released: 12/23/2015 Document Revised: 08/15/2016 Document Reviewed: 09/27/2015 Elsevier Interactive Patient Education  2017 [employer].  Fall Prevention in the Home Falls can cause injuries. They can happen to people of all ages. There are many things you can do to make your home safe and to help prevent falls. What can I do on the outside of my home? Regularly fix the edges of walkways and driveways and fix any cracks. Remove anything that might make you trip as you walk through a door, such as a raised step or threshold. Trim any bushes or trees on the path to your home. Use bright outdoor lighting. Clear any walking paths of anything that might make someone trip, such as rocks or tools. Regularly check to see if handrails are loose or broken. Make sure that both sides of any steps have handrails. Any raised decks and porches should have guardrails on the edges. Have any leaves, snow, or ice cleared regularly. Use sand or salt on walking paths during winter. Clean up any spills in your garage right away. This includes oil or grease spills. What can I do in the bathroom? Use night lights. Install grab bars by the toilet and in the tub and shower. Do not use towel bars as grab bars. Use non-skid mats or decals in the tub or shower. If you need to sit down in the shower, use a plastic, non-slip stool. Keep the floor dry. Clean up any water that spills on the floor as soon as it happens. Remove soap buildup in the tub or shower regularly. Attach bath mats securely with double-sided non-slip rug tape. Do not have throw rugs and other things on the floor that can make you trip. What can I do in the bedroom? Use night lights. Make sure that you have a light by your bed that is easy to  reach. Do not use any sheets or blankets that are too big for your bed. They should not hang down onto the floor. Have a firm chair that has side arms. You can use this for support while you get dressed. Do not have throw rugs and other things on the floor that can make you trip. What can I do in the kitchen? Clean up any spills right away. Avoid walking on wet floors. Keep items that you use a lot in easy-to-reach places. If you need to reach something above you, use a strong step stool that has a grab bar. Keep electrical cords out of the way. Do not use floor polish or wax that makes floors slippery. If you must use wax, use non-skid floor wax. Do not have throw rugs and other things on the floor that can make you trip. What can I do with my stairs? Do not leave any items on the stairs. Make sure that there are handrails on both sides of the stairs and use them. Fix handrails that are broken or loose. Make sure that handrails are as long as the stairways. Check any carpeting to make sure that it  is firmly attached to the stairs. Fix any carpet that is loose or worn. Avoid having throw rugs at the top or bottom of the stairs. If you do have throw rugs, attach them to the floor with carpet tape. Make sure that you have a light switch at the top of the stairs and the bottom of the stairs. If you do not have them, ask someone to add them for you. What else can I do to help prevent falls? Wear shoes that: Do not have high heels. Have rubber bottoms. Are comfortable and fit you well. Are closed at the toe. Do not wear sandals. If you use a stepladder: Make sure that it is fully opened. Do not climb a closed stepladder. Make sure that both sides of the stepladder are locked into place. Ask someone to hold it for you, if possible. Clearly mark and make sure that you can see: Any grab bars or handrails. First and last steps. Where the edge of each step is. Use tools that help you move  around (mobility aids) if they are needed. These include: Canes. Walkers. Scooters. Crutches. Turn on the lights when you go into a dark area. Replace any light bulbs as soon as they burn out. Set up your furniture so you have a clear path. Avoid moving your furniture around. If any of your floors are uneven, fix them. If there are any pets around you, be aware of where they are. Review your medicines with your doctor. Some medicines can make you feel dizzy. This can increase your chance of falling. Ask your doctor what other things that you can do to help prevent falls. This information is not intended to replace advice given to you by your health care provider. Make sure you discuss any questions you have with your health care provider. Document Released: 09/22/2009 Document Revised: 05/03/2016 Document Reviewed: 12/31/2014 Elsevier Interactive Patient Education  2017 [employer].

## 2023-11-11 NOTE — Progress Notes (Signed)
Subjective:   Zachary Lawrence. is a 72 y.o. male who presents for Medicare Annual/Subsequent preventive examination.  Visit Complete: In person  Cardiac Risk Factors include: advanced age (>42men, >13 women);dyslipidemia;male gender     Objective:    Today's Vitals   11/11/23 1022  BP: 100/64  Pulse: 71  Weight: 176 lb (79.8 kg)  Height: 6' (1.829 m)   Body mass index is 23.87 kg/m.     11/11/2023   10:35 AM 10/29/2022   10:25 AM 02/23/2022   11:20 AM 10/26/2021    9:42 AM 08/31/2021    5:17 PM 04/27/2020    9:20 AM 02/26/2018    9:26 AM  Advanced Directives  Does Patient Have a Medical Advance Directive? Yes Yes Yes Yes Yes No No  Type of Estate agent of [place];Living will Healthcare Power of [place];Living will Living will;Healthcare Power of [employer] Power of [place];Living will     Does patient want to make changes to medical advance directive? No - Patient declined        Copy of Healthcare Power of Attorney in Chart? No - copy requested No - copy requested  No - copy requested     Would patient like information on creating a medical advance directive?      No - Patient declined Yes (MAU/Ambulatory/Procedural Areas - Information given)    Current Medications (verified) Outpatient Encounter Medications as of 11/11/2023  Medication Sig   aspirin EC 325 MG tablet Take 325 mg by mouth as needed for mild pain. As directed   escitalopram (LEXAPRO) 20 MG tablet Take 20 mg by mouth daily.   hydroxychloroquine (PLAQUENIL) 200 MG tablet Take 400 mg by mouth daily.   OVER THE COUNTER MEDICATION as directed. Aerie's- otc for eyes.   testosterone cypionate (DEPOTESTOSTERONE CYPIONATE) 200 MG/ML injection Inject 200 mg into the muscle every 14 (fourteen) days.   No facility-administered encounter medications on file as of 11/11/2023.    Allergies (verified) Atorvastatin   History: Past Medical History:  Diagnosis Date   Anxiety     Arthritis    Cataract    Fatty liver    GERD (gastroesophageal reflux disease)    History of hepatitis C    s/p curative therapy in the 90's.     Nephrolithiasis 07/2022   incidental finding on [place]   Orthostatic hypotension 06/15/2021   Overactive bladder    Past Surgical History:  Procedure Laterality Date   APPENDECTOMY     CATARACT EXTRACTION Bilateral    CHOLECYSTECTOMY  1992   EYE SURGERY     TONSILLECTOMY     Family History  Problem Relation Age of Onset   Alzheimer's disease Mother    Arthritis Mother    CAD Father 29       CABG/Stent   Heart disease Father        CAD with CABG and stent   Ovarian cancer Sister    Heart disease Paternal Uncle    Cancer Paternal Uncle        prostate cancer   Social History   Socioeconomic History   Marital status: Divorced    Spouse name: Not on file   Number of children: 2   Years of education: Not on file   Highest education level: Not on file  Occupational History   Occupation: realtor  Tobacco Use   Smoking status: Former    Current packs/day: 0.00    Average packs/day: 0.1 packs/day for  6.0 years (0.6 ttl pk-yrs)    Types: Cigarettes    Start date: 12/10/1969    Quit date: 12/11/1975    Years since quitting: 47.9    Passive exposure: Never   Smokeless tobacco: Never  Vaping Use   Vaping status: Never Used  Substance and Sexual Activity   Alcohol use: Yes    Alcohol/week: 4.0 standard drinks of alcohol    Types: 2 Cans of beer, 2 Standard drinks or equivalent per week    Comment: social   Drug use: No   Sexual activity: Yes  Other Topics Concern   Not on file  Social History Narrative   Lives alone.     Social Determinants of Health   Financial Resource Strain: Low Risk  (11/11/2023)   Overall Financial Resource Strain (CARDIA)    Difficulty of Paying Living Expenses: Not hard at all  Food Insecurity: No Food Insecurity (11/11/2023)   Hunger Vital Sign    Worried About Running Out of Food in the Last Year:  Never true    Ran Out of Food in the Last Year: Never true  Transportation Needs: No Transportation Needs (11/11/2023)   PRAPARE - Administrator, Civil Service (Medical): No    Lack of Transportation (Non-Medical): No  Physical Activity: Sufficiently Active (11/11/2023)   Exercise Vital Sign    Days of Exercise per Week: 5 days    Minutes of Exercise per Session: 80 min  Stress: No Stress Concern Present (11/11/2023)   [employer] of Occupational Health - Occupational Stress Questionnaire    Feeling of Stress : Only a little  Social Connections: Moderately Integrated (11/11/2023)   Social Connection and Isolation Panel [NHANES]    Frequency of Communication with Friends and Family: More than three times a week    Frequency of Social Gatherings with Friends and Family: More than three times a week    Attends Religious Services: 1 to 4 times per year    Active Member of [employer] or Organizations: Yes    Attends Engineer, structural: More than 4 times per year    Marital Status: Divorced    Tobacco Counseling Counseling given: Not Answered   Clinical Intake:  Pre-visit preparation completed: Yes  Pain : No/denies pain  BMI - recorded: 23.87 Nutritional Status: BMI of 19-24  Normal Nutritional Risks: None Diabetes: No  How often do you need to have someone help you when you read instructions, pamphlets, or other written materials from your doctor or pharmacy?: 1 - Never  Interpreter Needed?: No  Information entered by :: Donne Anon, CMA   Activities of Daily Living    11/11/2023   10:24 AM 10/24/2023   10:21 AM  In your present state of health, do you have any difficulty performing the following activities:  Hearing? 0 0  Vision? 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 0 0  Walking or climbing stairs? 0 0  Dressing or bathing? 0 0  Doing errands, shopping? 0 0  Preparing Food and eating ?  N N  Using the Toilet? N N  In the past six months, have you accidently leaked urine? Y N  Do you have problems with loss of bowel control? N N  Managing your Medications? N N  Managing your Finances? N N  Housekeeping or managing your Housekeeping? N N    Patient Care Team: Sandford Craze, NP as PCP - General (Internal  Medicine) Marinus Maw, MD as PCP - Electrophysiology (Cardiology) Bernette Redbird, MD as Consulting Physician (Gastroenterology)  Indicate any recent Medical Services you may have received from other than [facility] providers in the past year (date may be approximate).     Assessment:   This is a routine wellness examination for [facility].  Hearing/Vision screen No results found.   Goals Addressed   None    Depression Screen    11/11/2023   10:34 AM 10/29/2022   10:23 AM 10/26/2021    9:49 AM 06/14/2021    9:31 AM 04/27/2020    9:02 AM 01/14/2019   11:10 AM 02/26/2018    9:28 AM  PHQ 2/9 Scores  PHQ - 2 Score 0 0 0 0 0 0 0    Fall Risk    11/11/2023   10:30 AM 10/24/2023   10:21 AM 10/29/2022   10:24 AM 10/26/2021    9:47 AM 06/14/2021    9:31 AM  Fall Risk   Falls in the past year? 0 0 0 0 0  Number falls in past yr: 0 0 0 0 0  Injury with Fall? 0 0 0 0 0  Risk for fall due to : No Fall Risks  No Fall Risks    Follow up Falls evaluation completed  Falls evaluation completed Falls prevention discussed     MEDICARE RISK AT HOME: Medicare Risk at Home Any stairs in or around the home?: Yes If so, are there any without handrails?: No Home free of loose throw rugs in walkways, pet beds, electrical cords, etc?: Yes Adequate lighting in your home to reduce risk of falls?: Yes Life alert?: No Use of a cane, walker or w/c?: No Grab bars in the bathroom?: No Shower chair or bench in shower?: Yes Elevated toilet seat or a handicapped toilet?: Yes (comfort height)  TIMED UP AND GO:  Was the test performed?  Yes  Length of time to ambulate 10 feet: 6  sec Gait steady and fast without use of assistive device    Cognitive Function:        11/11/2023   10:36 AM 10/29/2022   10:32 AM  6CIT Screen  What Year? 0 points 0 points  What month? 0 points 0 points  What time? 0 points 0 points  Count back from 20 0 points 0 points  Months in reverse 0 points 0 points  Repeat phrase 0 points 0 points  Total Score 0 points 0 points    Immunizations Immunization History  Administered Date(s) Administered   PNEUMOCOCCAL CONJUGATE-20 06/14/2021   Tdap 11/28/2009, 04/18/2018    TDAP status: Up to date  Flu Vaccine status: Declined, Education has been provided regarding the importance of this vaccine but patient still declined. Advised may receive this vaccine at local pharmacy or Health Dept. Aware to provide a copy of the vaccination record if obtained from local pharmacy or Health Dept. Verbalized acceptance and understanding.  Pneumococcal vaccine status: Up to date  Covid-19 vaccine status: Declined, Education has been provided regarding the importance of this vaccine but patient still declined. Advised may receive this vaccine at local pharmacy or Health Dept.or vaccine clinic. Aware to provide a copy of the vaccination record if obtained from local pharmacy or Health Dept. Verbalized acceptance and understanding.  Qualifies for Shingles Vaccine? Yes   Zostavax completed No   Shingrix Completed?: No.    Education has been provided regarding the importance of this vaccine. Patient has been advised to call  insurance company to determine out of pocket expense if they have not yet received this vaccine. Advised may also receive vaccine at local pharmacy or Health Dept. Verbalized acceptance and understanding.  Screening Tests Health Maintenance  Topic Date Due   Zoster Vaccines- Shingrix (1 of 2) Never done   Medicare Annual Wellness (AWV)  10/30/2023   DTaP/Tdap/Td (3 - Td or Tdap) 04/18/2028   Colonoscopy  09/25/2030   Pneumonia  Vaccine 43+ Years old  Completed   Hepatitis C Screening  Completed   HPV VACCINES  Aged Out   INFLUENZA VACCINE  Discontinued   COVID-19 Vaccine  Discontinued    Health Maintenance  Health Maintenance Due  Topic Date Due   Zoster Vaccines- Shingrix (1 of 2) Never done   Medicare Annual Wellness (AWV)  10/30/2023    Colorectal cancer screening: Type of screening: Colonoscopy. Completed 09/26/23. Repeat every 7 years  Lung Cancer Screening: (Low Dose CT Chest recommended if Age 51-80 years, 20 pack-year currently smoking OR have quit w/in 15years.) does not qualify.   Additional Screening:  Hepatitis C Screening: does qualify; Completed 10/31/22  Vision Screening: Recommended annual ophthalmology exams for early detection of glaucoma and other disorders of the eye. Is the patient up to date with their annual eye exam?  Yes  Who is the provider or what is the name of the office in which the patient attends annual eye exams? [facility] - Dr. Cathey Endow If pt is not established with a provider, would they like to be referred to a provider to establish care? No .   Dental Screening: Recommended annual dental exams for proper oral hygiene  Diabetic Foot Exam: N/a  Community Resource Referral / Chronic Care Management: CRR required this visit?  No   CCM required this visit?  No     Plan:     I have personally reviewed and noted the following in the patient's chart:   Medical and social history Use of alcohol, tobacco or illicit drugs  Current medications and supplements including opioid prescriptions. Patient is not currently taking opioid prescriptions. Functional ability and status Nutritional status Physical activity Advanced directives List of other physicians Hospitalizations, surgeries, and ER visits in previous 12 months Vitals Screenings to include cognitive, depression, and falls Referrals and appointments  In addition, I have reviewed and discussed  with patient certain preventive protocols, quality metrics, and best practice recommendations. A written personalized care plan for preventive services as well as general preventive health recommendations were provided to patient.     Donne Anon, CMA   11/11/2023   After Visit Summary: (In Person-Declined) Patient declined AVS at this time.  Nurse Notes: None

## 2023-11-18 DIAGNOSIS — D510 Vitamin B12 deficiency anemia due to intrinsic factor deficiency: Secondary | ICD-10-CM | POA: Diagnosis not present

## 2023-11-18 DIAGNOSIS — E291 Testicular hypofunction: Secondary | ICD-10-CM | POA: Diagnosis not present

## 2023-12-02 DIAGNOSIS — E291 Testicular hypofunction: Secondary | ICD-10-CM | POA: Diagnosis not present

## 2023-12-02 DIAGNOSIS — D510 Vitamin B12 deficiency anemia due to intrinsic factor deficiency: Secondary | ICD-10-CM | POA: Diagnosis not present

## 2023-12-06 DIAGNOSIS — H35722 Serous detachment of retinal pigment epithelium, left eye: Secondary | ICD-10-CM | POA: Diagnosis not present

## 2023-12-06 DIAGNOSIS — H43813 Vitreous degeneration, bilateral: Secondary | ICD-10-CM | POA: Diagnosis not present

## 2023-12-06 DIAGNOSIS — H353132 Nonexudative age-related macular degeneration, bilateral, intermediate dry stage: Secondary | ICD-10-CM | POA: Diagnosis not present

## 2023-12-06 DIAGNOSIS — Z961 Presence of intraocular lens: Secondary | ICD-10-CM | POA: Diagnosis not present

## 2024-10-13 LAB — LAB REPORT - SCANNED
EGFR: 57
Free T4: 1.21 ng/dL
TSH: 2 (ref 0.41–5.90)

## 2024-11-26 ENCOUNTER — Telehealth: Payer: Self-pay | Admitting: *Deleted

## 2024-11-26 ENCOUNTER — Ambulatory Visit

## 2024-11-26 VITALS — BP 114/74 | HR 64 | Temp 97.8°F | Resp 16 | Ht 70.5 in | Wt 183.8 lb

## 2024-11-26 DIAGNOSIS — Z0000 Encounter for general adult medical examination without abnormal findings: Secondary | ICD-10-CM | POA: Diagnosis not present

## 2024-11-26 NOTE — Patient Instructions (Addendum)
 Mr. Wrinkle,  Thank you for taking the time for your Medicare Wellness Visit. I appreciate your continued commitment to your health goals. Please review the care plan we discussed, and feel free to reach out if I can assist you further.  Please note that Annual Wellness Visits do not include a physical exam. Some assessments may be limited, especially if the visit was conducted virtually. If needed, we may recommend an in-person follow-up with your provider.  Ongoing Care Seeing your primary care provider every 3 to 6 months helps us  monitor your health and provide consistent, personalized care.   Eleanor Ponto, NP: 01/06/25 10:20am Medicare AWV:  11/30/25 10:20am in person  Referrals If a referral was made during today's visit and you haven't received any updates within two weeks, please contact the referred provider directly to check on the status.  Recommended Screenings:  Health Maintenance  Topic Date Due   Zoster (Shingles) Vaccine (1 of 2) Never done   Medicare Annual Wellness Visit  11/10/2024   DTaP/Tdap/Td vaccine (3 - Td or Tdap) 04/18/2028   Colon Cancer Screening  09/25/2030   Pneumococcal Vaccine for age over 79  Completed   Hepatitis C Screening  Completed   Meningitis B Vaccine  Aged Out   Flu Shot  Discontinued   COVID-19 Vaccine  Discontinued       11/09/2024   11:36 AM  Advanced Directives  Does Patient Have a Medical Advance Directive? Yes  Type of Estate Agent of [place];Living will  Does patient want to make changes to medical advance directive? No - Patient declined  Copy of Healthcare Power of Attorney in Chart? No - copy requested   Please b3 ring a copy of your health care power of attorney and living will to the office to be added to your chart at your convenience. You can mail a copy to [facility] [address] [place], [place] [address] or email to [email or web address]  Vision: Annual vision  screenings are recommended for early detection of glaucoma, cataracts, and diabetic retinopathy. These exams can also reveal signs of chronic conditions such as diabetes and high blood pressure.  Dental: Annual dental screenings help detect early signs of oral cancer, gum disease, and other conditions linked to overall health, including heart disease and diabetes.  Please see the attached documents for additional preventive care recommendations.

## 2024-11-26 NOTE — Progress Notes (Signed)
 Please attest this visit in the absence of patient primary care provider.   Chief Complaint  Patient presents with   Medicare Wellness     Subjective:   Zachary Lawrence. is a 73 y.o. male who presents for a Medicare Annual Wellness Visit.  Visit info / Clinical Intake: Medicare Wellness Visit Type:: Subsequent Annual Wellness Visit Persons participating in visit and providing information:: patient Medicare Wellness Visit Mode:: In-person (required for WTM) Interpreter Needed?: No Pre-visit prep was completed: yes AWV questionnaire completed by patient prior to visit?: yes Date:: 11/09/24 Living arrangements:: (!) lives alone Patient's Overall Health Status Rating: good Typical amount of pain: none Does pain affect daily life?: no Are you currently prescribed opioids?: no  Dietary Habits and Nutritional Risks How many meals a day?: 3 Eats fruit and vegetables daily?: yes Most meals are obtained by: preparing own meals; eating out In the last 2 weeks, have you had any of the following?: none Diabetic:: no  Functional Status Activities of Daily Living (to include ambulation/medication): (Patient-Rptd) Independent Ambulation: (Patient-Rptd) Independent Medication Administration: Independent Home Management (perform basic housework or laundry): (Patient-Rptd) Independent Manage your own finances?: yes Primary transportation is: driving Concerns about vision?: no *vision screening is required for WTM* (up to date with Dr Waylan / [facility] ophthalmology) Concerns about hearing?: no  Fall Screening Falls in the past year?: 0 Number of falls in past year: (Patient-Rptd) 0 Was there an injury with Fall?: 0 Fall Risk Category Calculator: 0 Patient Fall Risk Level: Low Fall Risk  Fall Risk Patient at Risk for Falls Due to: No Fall Risks Fall risk Follow up: Falls evaluation completed  Home and Transportation Safety: All rugs have non-skid backing?: yes All stairs or  steps have railings?: yes Grab bars in the bathtub or shower?: (!) no Have non-skid surface in bathtub or shower?: yes (has shower seat) Good home lighting?: yes Regular seat belt use?: yes Hospital stays in the last year:: no  Cognitive Assessment Difficulty concentrating, remembering, or making decisions? : no Will 6CIT or Mini Cog be Completed: yes What year is it?: 0 points What month is it?: 0 points Give patient an address phrase to remember (5 components): [address] [place]  About what time is it?: 0 points Count backwards from 20 to 1: 0 points Say the months of the year in reverse: 0 points Repeat the address phrase from earlier: 0 points 6 CIT Score: 0 points  Advance Directives (For Healthcare) Does Patient Have a Medical Advance Directive?: Yes Does patient want to make changes to medical advance directive?: No - Patient declined Type of Advance Directive: Healthcare Power of [place]; Living will Copy of Healthcare Power of Attorney in Chart?: No - copy requested Copy of Living Will in Chart?: No - copy requested  Reviewed/Updated  Reviewed/Updated: Reviewed All (Medical, Surgical, Family, Medications, Allergies, Care Teams, Patient Goals)    Allergies (verified) Atorvastatin    Current Medications (verified) Outpatient Encounter Medications as of 11/26/2024  Medication Sig   aspirin EC 325 MG tablet Take 325 mg by mouth as needed for mild pain. As directed   cyanocobalamin  (VITAMIN B12) 1000 MCG/ML injection Inject 1,000 mcg into the muscle every 14 (fourteen) days.   escitalopram  (LEXAPRO ) 20 MG tablet Take 20 mg by mouth daily.   hydroxychloroquine (PLAQUENIL) 200 MG tablet Take 400 mg by mouth daily.   Multiple Vitamins-Minerals (ICAPS AREDS 2 PO) Take 2 capsules by mouth daily.   OVER THE COUNTER MEDICATION as  directed. Aerie's- otc for eyes.   testosterone  cypionate (DEPOTESTOSTERONE CYPIONATE) 200 MG/ML injection Inject 200 mg into the muscle  every 14 (fourteen) days.   No facility-administered encounter medications on file as of 11/26/2024.    History: Past Medical History:  Diagnosis Date   Anxiety    Arthritis    Cataract    Fatty liver    GERD (gastroesophageal reflux disease)    History of hepatitis C    s/p curative therapy in the 90's.     Low testosterone     Nephrolithiasis 07/2022   incidental finding on US    Orthostatic hypotension 06/15/2021   Overactive bladder    Past Surgical History:  Procedure Laterality Date   APPENDECTOMY     CATARACT EXTRACTION Bilateral    CHOLECYSTECTOMY  1992   EYE SURGERY     TONSILLECTOMY     Family History  Problem Relation Age of Onset   Alzheimer's disease Mother    Arthritis Mother    CAD Father 22       CABG/Stent   Heart disease Father        CAD with CABG and stent   Ovarian cancer Sister    Heart disease Paternal Uncle    Cancer Paternal Uncle        prostate cancer   Social History   Occupational History   Occupation: realtor  Tobacco Use   Smoking status: Former    Current packs/day: 0.00    Average packs/day: 0.1 packs/day for 6.0 years (0.6 ttl pk-yrs)    Types: Cigarettes    Start date: 12/10/1969    Quit date: 12/11/1975    Years since quitting: 48.9    Passive exposure: Never   Smokeless tobacco: Never  Vaping Use   Vaping status: Never Used  Substance and Sexual Activity   Alcohol use: Yes    Alcohol/week: 4.0 standard drinks of alcohol    Types: 2 Cans of beer, 2 Standard drinks or equivalent per week    Comment: social, on weekends   Drug use: No   Sexual activity: Yes   Tobacco Counseling Counseling given: Not Answered  SDOH Screenings   Food Insecurity: No Food Insecurity (11/09/2024)  Housing: Low Risk (11/09/2024)  Transportation Needs: No Transportation Needs (11/09/2024)  Utilities: Not At Risk (11/26/2024)  Alcohol Screen: Low Risk (11/09/2024)  Depression (PHQ2-9): Low Risk (11/26/2024)  Financial Resource Strain: Low  Risk (11/09/2024)  Physical Activity: Sufficiently Active (11/09/2024)  Social Connections: Socially Isolated (11/09/2024)  Stress: No Stress Concern Present (11/09/2024)  Tobacco Use: Medium Risk (11/26/2024)  Health Literacy: Adequate Health Literacy (11/11/2023)   See flowsheets for full screening details  Depression Screen PHQ 2 & 9 Depression Scale- Over the past 2 weeks, how often have you been bothered by any of the following problems? Little interest or pleasure in doing things: 0 Feeling down, depressed, or hopeless (PHQ Adolescent also includes...irritable): 0 PHQ-2 Total Score: 0 Trouble falling or staying asleep, or sleeping too much: 0 Feeling tired or having little energy: 1 (Feels stressed about what needs to be done for the day but feels better mentally as the day goes on. Feels stressed by short timelines) Poor appetite or overeating (PHQ Adolescent also includes...weight loss): 0 Feeling bad about yourself - or that you are a failure or have let yourself or your family down: 0 Trouble concentrating on things, such as reading the newspaper or watching television (PHQ Adolescent also includes...like school work): 0 Moving or speaking so slowly  that other people could have noticed. Or the opposite - being so fidgety or restless that you have been moving around a lot more than usual: 0 Thoughts that you would be better off dead, or of hurting yourself in some way: 0 PHQ-9 Total Score: 1 If you checked off any problems, how difficult have these problems made it for you to do your work, take care of things at home, or get along with other people?: Not difficult at all     Goals Addressed   None          Objective:    Today's Vitals   11/26/24 1022  BP: 114/74  Pulse: 64  Resp: 16  Temp: 97.8 F (36.6 C)  TempSrc: Oral  SpO2: 99%  Weight: 183 lb 12.8 oz (83.4 kg)  Height: 5' 10.5 (1.791 m)   Body mass index is 26 kg/m.  Hearing/Vision screen No results  found. Immunizations and Health Maintenance Health Maintenance  Topic Date Due   Zoster Vaccines- Shingrix  (1 of 2) Never done   Medicare Annual Wellness (AWV)  11/26/2025   DTaP/Tdap/Td (3 - Td or Tdap) 04/18/2028   Colonoscopy  09/25/2030   Pneumococcal Vaccine: 50+ Years  Completed   Hepatitis C Screening  Completed   Meningococcal B Vaccine  Aged Out   Influenza Vaccine  Discontinued   COVID-19 Vaccine  Discontinued        Assessment/Plan:  This is a routine wellness examination for [facility]  Patient Care Team: Daryl Setter, NP as PCP - General (Internal Medicine) Waddell Danelle ORN, MD as PCP - Electrophysiology (Cardiology) Donnald Charleston, MD as Consulting Physician (Gastroenterology) Nonah Camellia ORN, MD as Consulting Physician (Optometry) Puschinsky, Charlie ORN., MD (Urology)  I have personally reviewed and noted the following in the patients chart:   Medical and social history Use of alcohol, tobacco or illicit drugs  Current medications and supplements including opioid prescriptions. Functional ability and status Nutritional status Physical activity Advanced directives List of other physicians Hospitalizations, surgeries, and ER visits in previous 12 months Vitals Screenings to include cognitive, depression, and falls Referrals and appointments  No orders of the defined types were placed in this encounter.  In addition, I have reviewed and discussed with patient certain preventive protocols, quality metrics, and best practice recommendations. A written personalized care plan for preventive services as well as general preventive health recommendations were provided to patient.   Lolita Libra, CMA   11/26/2024   Return in 1 year (on 11/26/2025).  After Visit Summary: (In Person-Printed) AVS printed and given to the patient  Nurse Notes: Separate telephone note sent for (additional concerns) Vaccines not given: Flu declined today

## 2024-11-26 NOTE — Telephone Encounter (Signed)
 FYI:  Pt had AWV today.   He reports low blood pressure / lightheadedness episodes are less frequent but still occurring. He has been receiving testosterone  injections every 2 weeks as well as B12 inj every 2 weeks. States B12 was not low but his doctor thought this would help in conjunction with the testosterone  for his fatigue / weakness.   He states he has had 2 panic attacks recently and is currently on Lexapro . Has developed a heat intolerance this year as well as a tremor in his right arm/hand.  Has scheduled OV with PCP on 01/06/25 10:40am ( ).

## 2025-01-06 ENCOUNTER — Ambulatory Visit: Admitting: Family

## 2025-01-13 ENCOUNTER — Encounter: Payer: Self-pay | Admitting: Family

## 2025-01-13 ENCOUNTER — Ambulatory Visit: Admitting: Family

## 2025-01-13 VITALS — BP 104/70 | HR 68 | Temp 97.8°F | Resp 18 | Ht 70.5 in | Wt 188.4 lb

## 2025-01-13 DIAGNOSIS — G909 Disorder of the autonomic nervous system, unspecified: Secondary | ICD-10-CM

## 2025-01-13 DIAGNOSIS — F411 Generalized anxiety disorder: Secondary | ICD-10-CM

## 2025-01-13 DIAGNOSIS — E291 Testicular hypofunction: Secondary | ICD-10-CM

## 2025-01-13 DIAGNOSIS — R251 Tremor, unspecified: Secondary | ICD-10-CM

## 2025-01-13 NOTE — Progress Notes (Unsigned)
" ° °  Acute Office Visit  Subjective:    Patient ID: Debby SHAUNNA Lennie Mickey., male    DOB: November 30, 1951, 74 y.o.   MRN: [id number]  Chief Complaint  Patient presents with   Fatigue   HPI Patient is in today for for concerns with bilateral hand tremor that started a few months ago. He is also reporting unsolicited drooling from both corners of mouth. Notes a shuffling gait and needing to feel like always standing like he is going forward. He reports needing to rest after doing small tasks like shaving his face. He reports taking lexapro  20 mg for anxiety, but is feeling acute anxiety in the morning upon waking that improves before lunch time after eating and drinking his coffee.   Review of Systems  Constitutional: Negative.   HENT: Negative.         Reports new onset of drooling episodes not provoked by anything  Eyes: Negative.   Respiratory: Negative.    Cardiovascular: Negative.   Gastrointestinal: Negative.   Genitourinary: Negative.   Musculoskeletal: Negative.   Skin: Negative.   Neurological:  Positive for dizziness and tremors.       Patient reports feeling dizzy when doing activity, also reports intentional tremor in both hands   Psychiatric/Behavioral:  The patient is nervous/anxious.        In mornings, but resolves by lunch     Objective:    BP 104/70 (BP Location: Right Arm, Patient Position: Sitting, Cuff Size: Large)   Pulse 68   Temp 97.8 F (36.6 C) (Oral)   Resp 18   Ht 5' 10.5 (1.791 m)   Wt 188 lb 6.4 oz (85.5 kg)   SpO2 98%   BMI 26.65 kg/m   Physical Exam Constitutional:      Appearance: Normal appearance.  HENT:     Head: Normocephalic.  Cardiovascular:     Rate and Rhythm: Normal rate and regular rhythm.     Heart sounds: Normal heart sounds.  Pulmonary:     Effort: Pulmonary effort is normal.     Breath sounds: Normal breath sounds.  Musculoskeletal:        General: Normal range of motion.  Neurological:     Mental Status: He is alert and  oriented to person, place, and time.     Motor: No weakness.     Coordination: Coordination abnormal.     Gait: Gait normal.     Deep Tendon Reflexes: Reflexes normal.     Comments: Visualized tremor in both hands during the visit at different times  Psychiatric:        Mood and Affect: Mood normal.        Behavior: Behavior normal.      Assessment & Plan:  Tremor -referral for neurology differential diagnosis includes parkinson's due to characteristic tremor noted during visit and patients report of shuffling gait.   Anxiety -continue lexapro  20 mg; instructed patient to try taking medication at night to see if he can maintain coverage of his anxiety in the early morning. This is reasonable option considering anxiety is resolved after taking AM dose of lexapro   Follow up in 6 weeks or sooner if symptoms worsen or do not improve.  Levon Budd, FNP-student  "

## 2025-01-13 NOTE — Progress Notes (Unsigned)
 "  Subjective:     Patient ID: Zachary SHAUNNA Lennie Mickey., male    DOB: Apr 22, 1951, 74 y.o.   MRN: [id number]  Chief Complaint  Patient presents with   Fatigue    HPI  Discussed the use of AI scribe software for clinical note transcription with the patient, who gave verbal consent to proceed.  History of Present Illness       Health Maintenance Due  Topic Date Due   Zoster Vaccines- Shingrix  (1 of 2) Never done    Past Medical History:  Diagnosis Date   Anxiety    Arthritis    Cataract    Fatty liver    GERD (gastroesophageal reflux disease)    History of hepatitis C    s/p curative therapy in the 90's.     Low testosterone     Nephrolithiasis 07/2022   incidental finding on US    Orthostatic hypotension 06/15/2021   Overactive bladder     Past Surgical History:  Procedure Laterality Date   APPENDECTOMY     CATARACT EXTRACTION Bilateral    CHOLECYSTECTOMY  1992   EYE SURGERY     TONSILLECTOMY      Family History  Problem Relation Age of Onset   Alzheimer's disease Mother    Arthritis Mother    CAD Father 72       CABG/Stent   Heart disease Father        CAD with CABG and stent   Ovarian cancer Sister    Heart disease Paternal Uncle    Cancer Paternal Uncle        prostate cancer    Social History   Socioeconomic History   Marital status: Divorced    Spouse name: Not on file   Number of children: 2   Years of education: Not on file   Highest education level: Bachelor's degree (e.g., BA, AB, BS)  Occupational History   Occupation: realtor  Tobacco Use   Smoking status: Former    Current packs/day: 0.00    Average packs/day: 0.1 packs/day for 6.0 years (0.6 ttl pk-yrs)    Types: Cigarettes    Start date: 12/10/1969    Quit date: 12/11/1975    Years since quitting: 49.1    Passive exposure: Never   Smokeless tobacco: Never  Vaping Use   Vaping status: Never Used  Substance and Sexual Activity   Alcohol use: Yes     Alcohol/week: 4.0 standard drinks of alcohol    Types: 2 Cans of beer, 2 Standard drinks or equivalent per week    Comment: social, on weekends   Drug use: No   Sexual activity: Yes  Other Topics Concern   Not on file  Social History Narrative   Lives alone.     Social Drivers of Health   Tobacco Use: Medium Risk (01/13/2025)   Patient History    Smoking Tobacco Use: Former    Smokeless Tobacco Use: Never    Passive Exposure: Never  Physicist, Medical Strain: Low Risk (11/09/2024)   Overall Financial Resource Strain (CARDIA)    Difficulty of Paying Living Expenses: Not hard at all  Food Insecurity: No Food Insecurity (11/09/2024)   Epic    Worried About Programme Researcher, Broadcasting/film/video in the Last Year: Never true    Ran Out of Food in the Last Year: Never true  Transportation Needs: No Transportation Needs (11/09/2024)   Epic    Lack of Transportation (Medical): No    Lack of Transportation (Non-Medical):  No  Physical Activity: Sufficiently Active (11/09/2024)   Exercise Vital Sign    Days of Exercise per Week: 4 days    Minutes of Exercise per Session: 60 min  Stress: No Stress Concern Present (11/09/2024)   [employer] of Occupational Health - Occupational Stress Questionnaire    Feeling of Stress: Only a little  Social Connections: Socially Isolated (11/09/2024)   Social Connection and Isolation Panel    Frequency of Communication with Friends and Family: More than three times a week    Frequency of Social Gatherings with Friends and Family: Twice a week    Attends Religious Services: Never    Database Administrator or Organizations: No    Attends Engineer, Structural: Not on file    Marital Status: Divorced  Intimate Partner Violence: Not At Risk (11/26/2024)   Epic    Fear of Current or Ex-Partner: No    Emotionally Abused: No    Physically Abused: No    Sexually Abused: No  Depression (PHQ2-9): Medium Risk (01/13/2025)   Depression (PHQ2-9)     PHQ-2 Score: 5  Alcohol Screen: Low Risk (11/09/2024)   Alcohol Screen    Last Alcohol Screening Score (AUDIT): 3  Housing: Low Risk (11/09/2024)   Epic    Unable to Pay for Housing in the Last Year: No    Number of Times Moved in the Last Year: 0    Homeless in the Last Year: No  Utilities: Not At Risk (11/26/2024)   Epic    Threatened with loss of utilities: No  Health Literacy: Adequate Health Literacy (11/11/2023)   B1300 Health Literacy    Frequency of need for help with medical instructions: Never    Outpatient Medications Prior to Visit  Medication Sig Dispense Refill   aspirin EC 325 MG tablet Take 325 mg by mouth as needed for mild pain. As directed     cyanocobalamin  (VITAMIN B12) 1000 MCG/ML injection Inject 1,000 mcg into the muscle every 14 (fourteen) days.     escitalopram  (LEXAPRO ) 20 MG tablet Take 20 mg by mouth daily.     hydroxychloroquine (PLAQUENIL) 200 MG tablet Take 400 mg by mouth daily.     Multiple Vitamins-Minerals (ICAPS AREDS 2 PO) Take 2 capsules by mouth daily.     OVER THE COUNTER MEDICATION as directed. Aerie's- otc for eyes.     testosterone  cypionate (DEPOTESTOSTERONE CYPIONATE) 200 MG/ML injection Inject 200 mg into the muscle every 14 (fourteen) days.     No facility-administered medications prior to visit.    Allergies[1]  ROS     Objective:    Physical Exam   BP 104/70 (BP Location: Right Arm, Patient Position: Sitting, Cuff Size: Large)   Pulse 68   Temp 97.8 F (36.6 C) (Oral)   Resp 18   Ht 5' 10.5 (1.791 m)   Wt 188 lb 6.4 oz (85.5 kg)   SpO2 98%   BMI 26.65 kg/m  Wt Readings from Last 3 Encounters:  01/13/25 188 lb 6.4 oz (85.5 kg)  11/26/24 183 lb 12.8 oz (83.4 kg)  11/11/23 176 lb (79.8 kg)       Assessment & Plan:   Problem List Items Addressed This Visit   None   I am having Zachary Lawrence maintain his aspirin EC, OVER THE COUNTER MEDICATION, testosterone  cypionate, escitalopram ,  hydroxychloroquine, cyanocobalamin , and Multiple Vitamins-Minerals (ICAPS AREDS 2 PO).  No orders of the defined types were placed in this encounter.       [  1] Allergies Allergen Reactions   Atorvastatin  Other (See Comments)    Muscle and joint pain.  "

## 2025-01-14 DIAGNOSIS — F411 Generalized anxiety disorder: Secondary | ICD-10-CM | POA: Insufficient documentation

## 2025-01-14 DIAGNOSIS — G909 Disorder of the autonomic nervous system, unspecified: Secondary | ICD-10-CM | POA: Insufficient documentation

## 2025-01-14 DIAGNOSIS — E291 Testicular hypofunction: Secondary | ICD-10-CM | POA: Insufficient documentation

## 2025-01-14 DIAGNOSIS — R251 Tremor, unspecified: Secondary | ICD-10-CM | POA: Insufficient documentation

## 2025-01-14 NOTE — Assessment & Plan Note (Signed)
 This is being treated with testosterone  by another provider.  This provider is also prescribing plaquenil for unknown reasons. I am not aware of any autoimmune diagnosis for him.  Advised that patient discuss Plaquenil necessity with prescribing physician and consider discontinuation.

## 2025-01-14 NOTE — Assessment & Plan Note (Signed)
" °  Daily anxiety symptoms exacerbated by stressors. Lexapro  20 mg provides inadequate relief. Plaquenil not indicated for anxiety. - Switch Lexapro  administration to bedtime.- Monitor anxiety symptoms and consider alternative medications if no improvement.  "

## 2025-01-14 NOTE — Assessment & Plan Note (Signed)
 Tremor with gait disturbance and neurological symptoms Bilateral hand tremor with drooling and balance issues. Differential includes Parkinson's disease. - Referred to neurology for evaluation, including assessment for Parkinson's disease.

## 2025-01-14 NOTE — Patient Instructions (Signed)
" °  VISIT SUMMARY: During your visit, we discussed your hand tremors, heat intolerance, balance issues, and anxiety. We reviewed your current medications and lifestyle, and we have made some adjustments to help manage your symptoms more effectively.  YOUR PLAN: -TREMOR WITH GAIT DISTURBANCE AND NEUROLOGICAL SYMPTOMS: You have been experiencing hand tremors, balance issues, and drooling. These symptoms may be related to a neurological condition such as Parkinson's disease. We have referred you to a neurologist for further evaluation.  -AUTONOMIC DYSFUNCTION WITH ORTHOSTATIC INTOLERANCE: Your chronic dizziness, fatigue, and heat intolerance are symptoms of autonomic dysfunction, which affects the part of your nervous system that controls involuntary actions. We recommend continuing with dietary modifications to increase your sodium intake, avoiding heat exposure, and staying in air-conditioned environments during the summer.  -GENERALIZED ANXIETY DISORDER: You have been experiencing daily anxiety symptoms that are not adequately managed by your current medication. We suggest switching your Lexapro  to bedtime and discussing the necessity of Plaquenil with your prescribing physician. We will monitor your anxiety symptoms and consider alternative medications if there is no improvement.  INSTRUCTIONS: Please follow up with the neurologist for your tremors and balance issues. Continue with the dietary modifications and heat avoidance strategies for your autonomic dysfunction. Switch your Lexapro  to bedtime and discuss the necessity of Plaquenil with your prescribing physician. Monitor your anxiety symptoms and let us  know if there is no improvement so we can consider alternative medications.    Contains text generated by [employer].   "

## 2025-01-14 NOTE — Assessment & Plan Note (Signed)
" °  Autonomic dysfunction with orthostatic intolerance Chronic dizziness, fatigue, and heat intolerance. Previous treatments discontinued due to adverse effects. Current management includes dietary modifications and heat avoidance. - Continue dietary modifications with increased sodium intake. - Avoid heat exposure and stay in air-conditioned environments during summer. - Monitor symptoms and adjust management as needed.  "

## 2025-02-03 ENCOUNTER — Ambulatory Visit: Admitting: Family

## 2025-03-03 ENCOUNTER — Ambulatory Visit: Admitting: Family

## 2025-11-30 ENCOUNTER — Ambulatory Visit
